# Patient Record
Sex: Female | Born: 1970 | Race: Black or African American | Hispanic: No | Marital: Married | Smoking: Never smoker
Health system: Southern US, Community
[De-identification: ages and names within clinical notes are randomized; demographics above are authoritative.]

## PROBLEM LIST (undated history)

## (undated) DIAGNOSIS — E669 Obesity, unspecified: Secondary | ICD-10-CM

## (undated) DIAGNOSIS — Z8489 Family history of other specified conditions: Secondary | ICD-10-CM

## (undated) DIAGNOSIS — I10 Essential (primary) hypertension: Secondary | ICD-10-CM

## (undated) HISTORY — DX: Obesity, unspecified: E66.9

## (undated) HISTORY — PX: NO PAST SURGERIES: SHX2092

## (undated) HISTORY — DX: Essential (primary) hypertension: I10

## (undated) HISTORY — DX: Family history of other specified conditions: Z84.89

---

## 2005-02-15 ENCOUNTER — Ambulatory Visit: Payer: Self-pay | Admitting: Family Medicine

## 2005-02-19 ENCOUNTER — Ambulatory Visit (HOSPITAL_COMMUNITY): Admission: RE | Admit: 2005-02-19 | Discharge: 2005-02-19 | Payer: Self-pay | Admitting: Family Medicine

## 2005-03-25 ENCOUNTER — Ambulatory Visit: Payer: Self-pay | Admitting: Family Medicine

## 2007-03-04 ENCOUNTER — Ambulatory Visit: Payer: Self-pay | Admitting: Family Medicine

## 2007-03-05 ENCOUNTER — Encounter: Payer: Self-pay | Admitting: Family Medicine

## 2007-03-09 ENCOUNTER — Ambulatory Visit (HOSPITAL_COMMUNITY): Admission: RE | Admit: 2007-03-09 | Discharge: 2007-03-09 | Payer: Self-pay | Admitting: Family Medicine

## 2007-03-11 ENCOUNTER — Ambulatory Visit: Payer: Self-pay | Admitting: Family Medicine

## 2007-03-11 LAB — CONVERTED CEMR LAB
Nitrite: NEGATIVE
RBC / HPF: NONE SEEN (ref <3)
Specific Gravity, Urine: 1.029 (ref 1.005–1.03)
pH: 5 (ref 5.0–8.0)

## 2007-03-12 ENCOUNTER — Encounter: Payer: Self-pay | Admitting: Family Medicine

## 2007-04-06 ENCOUNTER — Ambulatory Visit: Payer: Self-pay | Admitting: Family Medicine

## 2007-04-06 ENCOUNTER — Other Ambulatory Visit: Admission: RE | Admit: 2007-04-06 | Discharge: 2007-04-06 | Payer: Self-pay | Admitting: Family Medicine

## 2007-04-06 ENCOUNTER — Encounter: Payer: Self-pay | Admitting: Family Medicine

## 2007-04-09 ENCOUNTER — Encounter: Payer: Self-pay | Admitting: Family Medicine

## 2007-06-27 ENCOUNTER — Inpatient Hospital Stay (HOSPITAL_COMMUNITY): Admission: AD | Admit: 2007-06-27 | Discharge: 2007-06-27 | Payer: Self-pay | Admitting: Obstetrics and Gynecology

## 2007-12-16 ENCOUNTER — Inpatient Hospital Stay (HOSPITAL_COMMUNITY): Admission: AD | Admit: 2007-12-16 | Discharge: 2007-12-18 | Payer: Self-pay | Admitting: Obstetrics & Gynecology

## 2008-01-28 ENCOUNTER — Ambulatory Visit: Payer: Self-pay | Admitting: Family Medicine

## 2008-01-28 DIAGNOSIS — I10 Essential (primary) hypertension: Secondary | ICD-10-CM | POA: Insufficient documentation

## 2008-01-28 DIAGNOSIS — E669 Obesity, unspecified: Secondary | ICD-10-CM | POA: Insufficient documentation

## 2008-10-19 ENCOUNTER — Ambulatory Visit: Payer: Self-pay | Admitting: Family Medicine

## 2008-10-19 ENCOUNTER — Other Ambulatory Visit: Admission: RE | Admit: 2008-10-19 | Discharge: 2008-10-19 | Payer: Self-pay | Admitting: Family Medicine

## 2008-10-21 ENCOUNTER — Encounter: Payer: Self-pay | Admitting: Family Medicine

## 2008-10-21 LAB — CONVERTED CEMR LAB
BUN: 9 mg/dL (ref 6–23)
Basophils Absolute: 0 10*3/uL (ref 0.0–0.1)
Basophils Relative: 0 % (ref 0–1)
Calcium: 9 mg/dL (ref 8.4–10.5)
Cholesterol: 160 mg/dL (ref 0–200)
Eosinophils Relative: 0 % (ref 0–5)
HCT: 40.9 % (ref 36.0–46.0)
Lymphocytes Relative: 13 % (ref 12–46)
MCHC: 34.7 g/dL (ref 30.0–36.0)
Neutro Abs: 8.4 10*3/uL — ABNORMAL HIGH (ref 1.7–7.7)
Platelets: 309 10*3/uL (ref 150–400)
Potassium: 4.5 meq/L (ref 3.5–5.3)
RDW: 13.2 % (ref 11.5–15.5)
Triglycerides: 73 mg/dL (ref <150)
VLDL: 15 mg/dL (ref 0–40)

## 2008-10-28 ENCOUNTER — Ambulatory Visit (HOSPITAL_COMMUNITY): Admission: RE | Admit: 2008-10-28 | Discharge: 2008-10-28 | Payer: Self-pay | Admitting: Family Medicine

## 2009-07-18 ENCOUNTER — Ambulatory Visit: Payer: Self-pay | Admitting: Family Medicine

## 2009-07-18 ENCOUNTER — Other Ambulatory Visit: Admission: RE | Admit: 2009-07-18 | Discharge: 2009-07-18 | Payer: Self-pay | Admitting: Family Medicine

## 2009-07-18 DIAGNOSIS — R5383 Other fatigue: Secondary | ICD-10-CM

## 2009-07-18 DIAGNOSIS — R5381 Other malaise: Secondary | ICD-10-CM | POA: Insufficient documentation

## 2009-07-20 LAB — CONVERTED CEMR LAB
BUN: 10 mg/dL (ref 6–23)
Basophils Relative: 0 % (ref 0–1)
CO2: 25 meq/L (ref 19–32)
Calcium: 9 mg/dL (ref 8.4–10.5)
Chloride: 104 meq/L (ref 96–112)
Creatinine, Ser: 0.84 mg/dL (ref 0.40–1.20)
Eosinophils Absolute: 0 10*3/uL (ref 0.0–0.7)
Eosinophils Relative: 0 % (ref 0–5)
Glucose, Bld: 86 mg/dL (ref 70–99)
HCT: 39.5 % (ref 36.0–46.0)
Hemoglobin: 13.5 g/dL (ref 12.0–15.0)
Lymphs Abs: 2.3 10*3/uL (ref 0.7–4.0)
MCHC: 34.2 g/dL (ref 30.0–36.0)
MCV: 86.1 fL (ref 78.0–100.0)
Monocytes Absolute: 0.4 10*3/uL (ref 0.1–1.0)
Monocytes Relative: 4 % (ref 3–12)
Neutrophils Relative %: 72 % (ref 43–77)
RBC: 4.59 M/uL (ref 3.87–5.11)
WBC: 9.9 10*3/uL (ref 4.0–10.5)

## 2010-01-30 ENCOUNTER — Ambulatory Visit: Payer: Self-pay | Admitting: Family Medicine

## 2010-04-28 ENCOUNTER — Encounter: Payer: Self-pay | Admitting: Family Medicine

## 2010-04-30 ENCOUNTER — Encounter: Payer: Self-pay | Admitting: Family Medicine

## 2010-05-10 ENCOUNTER — Ambulatory Visit: Payer: Medicaid Other | Admitting: Family Medicine

## 2010-05-10 ENCOUNTER — Ambulatory Visit: Admit: 2010-05-10 | Payer: Self-pay | Admitting: Family Medicine

## 2010-05-10 NOTE — Letter (Signed)
Summary: xray  xray   Imported By: Curtis Sites 09/05/2009 11:01:31  _____________________________________________________________________  External Attachment:    Type:   Image     Comment:   External Document

## 2010-05-10 NOTE — Letter (Signed)
Summary: progress notes  progress notes   Imported By: Curtis Sites 09/05/2009 11:00:47  _____________________________________________________________________  External Attachment:    Type:   Image     Comment:   External Document

## 2010-05-10 NOTE — Assessment & Plan Note (Signed)
Summary: physical   Vital Signs:  Patient profile:   40 year old female Menstrual status:  regular Height:      66.5 inches Weight:      251.25 pounds BMI:     40.09 O2 Sat:      98 % Pulse rate:   91 / minute Pulse rhythm:   regular Resp:     16 per minute BP sitting:   138 / 98  (left arm) Cuff size:   large  Vitals Entered By: Everitt Amber LPN (July 18, 2009 8:52 AM)  Nutrition Counseling: Patient's BMI is greater than 25 and therefore counseled on weight management options. CC: CPE  Vision Screening:Left eye w/o correction: 20 / 20 Right Eye w/o correction: 20 / 20 Both eyes w/o correction:  20/ 20  Color vision testing: normal      Vision Entered By: Everitt Amber LPN (July 18, 2009 8:53 AM)   CC:  CPE.  History of Present Illness: Reports  that she has been doing well. she recently started exercisisng 2 days per week, and is working on changing her eating to facilitate weight loss.she still drinks alot oc calories , and understands the need to change this Denies recent fever or chills. Denies sinus pressure, nasal congestion , ear pain or sore throat. Denies chest congestion, or cough productive of sputum. Denies chest pain, palpitations, PND, orthopnea or leg swelling. Denies abdominal pain, nausea, vomitting, diarrhea or constipation. Denies change in bowel movements or bloody stool. Denies dysuria , frequency, incontinence or hesitancy. Denies  joint pain, swelling, or reduced mobility. Denies headaches, vertigo, seizures. Denies depression, anxiety or insomnia. Denies  rash, lesions, or itch.     Current Medications (verified): 1)  Maxzide 75-50 Mg Tabs (Triamterene-Hctz) .... Take 1 Tablet By Mouth Once A Day  Allergies (verified): No Known Drug Allergies  Review of Systems      See HPI General:  Complains of fatigue. Eyes:  Denies blurring, discharge, double vision, eye pain, halos, itching, and vision loss-1 eye. Endo:  Denies cold  intolerance, excessive hunger, excessive thirst, excessive urination, heat intolerance, polyuria, and weight change. Heme:  Denies abnormal bruising and bleeding. Allergy:  Denies hives or rash and itching eyes.  Physical Exam  General:  Well-developed,obese,in no acute distress; alert,appropriate and cooperative throughout examination Head:  Normocephalic and atraumatic without obvious abnormalities. No apparent alopecia or balding. Eyes:  No corneal or conjunctival inflammation noted. EOMI. Perrla. Funduscopic exam benign, without hemorrhages, exudates or papilledema. Vision grossly normal. Ears:  External ear exam shows no significant lesions or deformities.  Otoscopic examination reveals clear canals, tympanic membranes are intact bilaterally without bulging, retraction, inflammation or discharge. Hearing is grossly normal bilaterally. Nose:  External nasal examination shows no deformity or inflammation. Nasal mucosa are pink and moist without lesions or exudates. Mouth:  Oral mucosa and oropharynx without lesions or exudates.  Teeth in good repair. Neck:  No deformities, masses, or tenderness noted. Chest Wall:  No deformities, masses, or tenderness noted. Breasts:  No mass, nodules, thickening, tenderness, bulging, retraction, inflamation, nipple discharge or skin changes noted.   Lungs:  Normal respiratory effort, chest expands symmetrically. Lungs are clear to auscultation, no crackles or wheezes. Heart:  Normal rate and regular rhythm. S1 and S2 normal without gallop, murmur, click, rub or other extra sounds. Abdomen:  Bowel sounds positive,abdomen soft and non-tender without masses, organomegaly or hernias noted. Genitalia:  Normal introitus for age, no external lesions, no vaginal discharge, mucosa pink  and moist, no vaginal or cervical lesions, no vaginal atrophy, no friaility or hemorrhage, normal uterus size and position, no adnexal masses or tenderness Msk:  No deformity or  scoliosis noted of thoracic or lumbar spine.   Pulses:  R and L carotid,radial,femoral,dorsalis pedis and posterior tibial pulses are full and equal bilaterally Extremities:  No clubbing, cyanosis, edema, or deformity noted with normal full range of motion of all joints.   Neurologic:  No cranial nerve deficits noted. Station and gait are normal. Plantar reflexes are down-going bilaterally. DTRs are symmetrical throughout. Sensory, motor and coordinative functions appear intact. Skin:  Intact without suspicious lesions or rashes Cervical Nodes:  No lymphadenopathy noted Axillary Nodes:  No palpable lymphadenopathy Inguinal Nodes:  No significant adenopathy Psych:  Cognition and judgment appear intact. Alert and cooperative with normal attention span and concentration. No apparent delusions, illusions, hallucinations   Impression & Recommendations:  Problem # 1:  HYPERTENSION (ICD-401.9) Assessment Unchanged  The following medications were removed from the medication list:    Maxzide 75-50 Mg Tabs (Triamterene-hctz) .Marland Kitchen... Take 1 tablet by mouth once a day Her updated medication list for this problem includes:    Maxzide-25 37.5-25 Mg Tabs (Triamterene-hctz) .Marland Kitchen... Take 1 tablet by mouth once a day  Orders: T-Basic Metabolic Panel [phone])  BP today: 138/98, pt has not been taking her bP med Prior BP: 134/90 (10/19/2008)  Labs Reviewed: K+: 4.5 (10/21/2008) Creat: : 0.83 (10/21/2008)   Chol: 160 (10/21/2008)   HDL: 50 (10/21/2008)   LDL: 95 (10/21/2008)   TG: 73 (10/21/2008)  Problem # 2:  OBESITY, UNSPECIFIED (ICD-278.00) Assessment: Deteriorated  Ht: 66.5 (07/18/2009)   Wt: 251.25 (07/18/2009)   BMI: 40.09 (07/18/2009), counselled for 10 mins and given literature on dietary change and the importance of exercise  Problem # 3:  FATIGUE (ICD-780.79) Assessment: Comment Only  Orders: T-TSH ([id number]) T-CBC w/Diff ([id number])  Complete Medication List: 1)  Maxzide-25  37.5-25 Mg Tabs (Triamterene-hctz) .... Take 1 tablet by mouth once a day  Other Orders: Tdap => 17yrs IM (84132) Admin 1st Vaccine (44010) Admin 1st Vaccine [facility]) [id number]) Pap Smear (64403)   Patient Instructions: 1)  Please schedule a follow-up appointment in 4 months. 2)  It is important that you exercise regularly at least 45 minutes 6 times a week. If you develop chest pain, have severe difficulty breathing, or feel very tired , stop exercising immediately and seek medical attention. 3)  You need to lose weight. Consider a lower calorie diet and regular exercise. Goal is 3 to 5 pounds per monmth 4)  BMP prior to visit, ICD-9: 5)  TSH prior to visit, ICD-9: 6)  CBC w/ Diff prior to visit, ICD-9: Prescriptions: MAXZIDE-25 37.5-25 MG TABS (TRIAMTERENE-HCTZ) Take 1 tablet by mouth once a day  #30 x 4   Entered and Authorized by:   Syliva Overman MD   Signed by:   Syliva Overman MD on 07/18/2009   Method used:   Electronically to        [facility] [address] # [phone]* (retail)       [address]       [place], [place]  [address]       Ph: [phone]       Fax: [phone]   RxID:   [id number]     Tetanus/Td Vaccine    Vaccine Type: Tdap    Site: left deltoid    Mfr: boosterix    Dose: 0.5 ml    Route: IM  Given by: Everitt Amber LPN    Exp. Date: 07/01/2011    Lot #: [id number]

## 2010-05-10 NOTE — Letter (Signed)
Summary: phone notes  phone notes   Imported By: Curtis Sites 09/05/2009 11:00:17  _____________________________________________________________________  External Attachment:    Type:   Image     Comment:   External Document

## 2010-05-10 NOTE — Assessment & Plan Note (Signed)
Summary: office visit   Vital Signs:  Patient profile:   40 year old female Menstrual status:  regular Height:      66.5 inches Weight:      247 pounds BMI:     39.41 O2 Sat:      98 % on Room air Pulse rate:   101 / minute Pulse rhythm:   regular Resp:     16 per minute BP sitting:   120 / 90  (left arm)  Vitals Entered By: Mauricia Area CMA (January 30, 2010 2:59 PM)  Nutrition Counseling: Patient's BMI is greater than 25 and therefore counseled on weight management options.  O2 Flow:  Room air CC: follow up   CC:  follow up.  History of Present Illness: Reports  that  she has been doing well. Her primary health concern is obesity, which she has been working at intermittently with exercise and dietary change. She intends to become more consistent in her efforts. Denies recent fever or chills. Denies sinus pressure, nasal congestion , ear pain or sore throat. Denies chest congestion, or cough productive of sputum. Denies chest pain, palpitations, PND, orthopnea or leg swelling. Denies abdominal pain, nausea, vomitting, diarrhea or constipation. Denies change in bowel movements or bloody stool. Denies dysuria , frequency, incontinence or hesitancy. Denies  joint pain, swelling, or reduced mobility. Denies headaches, vertigo, seizures. Denies depression, anxiety or insomnia. Denies  rash, lesions, or itch.     Current Medications (verified): 1)  Maxzide-25 37.5-25 Mg Tabs (Triamterene-Hctz) .... Take 1 Tablet By Mouth Once A Day  Allergies (verified): No Known Drug Allergies  Review of Systems      See HPI General:  Complains of fatigue. Eyes:  Denies discharge, eye pain, and red eye. Endo:  Denies cold intolerance, excessive hunger, excessive thirst, and heat intolerance. Heme:  Denies abnormal bruising and bleeding. Allergy:  Complains of seasonal allergies; denies hives or rash and itching eyes.  Physical Exam  General:  Well-developed,obese,in no acute  distress; alert,appropriate and cooperative throughout examination HEENT: No facial asymmetry,  EOMI, No sinus tenderness, TM's Clear, oropharynx  pink and moist.   Chest: Clear to auscultation bilaterally.  CVS: S1, S2, No murmurs, No S3.   Abd: Soft, Nontender.  MS: Adequate ROM spine, hips, shoulders and knees.  Ext: No edema.   CNS: CN 2-12 intact, power tone and sensation normal throughout.   Skin: Intact, no visible lesions or rashes.  Psych: Good eye contact, normal affect.  Memory intact, not anxious or depressed appearing.    Impression & Recommendations:  Problem # 1:  OBESITY, UNSPECIFIED (ICD-278.00) Assessment Improved  Orders: T-Lipid Profile ([id number]) T- Hemoglobin A1C (21308-65784)  Ht: 66.5 (01/30/2010)   Wt: 247 (01/30/2010)   BMI: 39.41 (01/30/2010) therapeutic lifestyle change discussed and encouraged  Problem # 2:  HYPERTENSION (ICD-401.9) Assessment: Improved  Her updated medication list for this problem includes:    Maxzide-25 37.5-25 Mg Tabs (Triamterene-hctz) .Marland Kitchen... Take 1 tablet by mouth once a day  Orders: T-Basic Metabolic Panel [id number])  BP today: 120/90 Prior BP: 138/98 (07/18/2009)  Labs Reviewed: K+: 3.8 (07/18/2009) Creat: : 0.84 (07/18/2009)   Chol: 160 (10/21/2008)   HDL: 50 (10/21/2008)   LDL: 95 (10/21/2008)   TG: 73 (10/21/2008)  Complete Medication List: 1)  Maxzide-25 37.5-25 Mg Tabs (Triamterene-hctz) .... Take 1 tablet by mouth once a day  Other Orders: T-Vitamin D (25-Hydroxy) ([id number])  Patient Instructions: 1)  Please schedule a follow-up appointment in 3 months. 2)  It is important that you exercise regularly at least 30 minutes 5 times a week. If you develop chest pain, have severe difficulty breathing, or feel very tired , stop exercising immediately and seek medical attention. 3)  You need to lose weight. Consider a lower calorie diet and regular exercise. Goal is 10 pounds in the next 3 months 4)  your  bP today is 120/90. lifestyle change as discussed woill improvethis, youy absolutely need to follow through 5)  you will get the dASH diet also low fat diet.also a 1200 cal diet sheet 6)  BMP prior to visit, ICD-9: 7)  Lipid Panel prior to visit, ICD-9:fasting asap 8)  HbgA1C prior to visit, ICD-9: 9)  vitamin D   Orders Added: 1)  Est. Patient Level IV [56387] 2)  T-Basic Metabolic Panel [[id number]] 3)  T-Lipid Profile [80061-22930] 4)  T- Hemoglobin A1C [83036-23375] 5)  T-Vitamin D (25-Hydroxy) [[id number]]

## 2010-05-10 NOTE — Letter (Signed)
Summary: labs  labs   Imported By: Curtis Sites 09/05/2009 11:01:08  _____________________________________________________________________  External Attachment:    Type:   Image     Comment:   External Document

## 2010-05-10 NOTE — Letter (Signed)
Summary: demographic  demographic   Imported By: Curtis Sites 09/05/2009 10:58:18  _____________________________________________________________________  External Attachment:    Type:   Image     Comment:   External Document

## 2010-05-10 NOTE — Letter (Signed)
Summary: consults  consults   Imported By: Curtis Sites 09/05/2009 11:00:00  _____________________________________________________________________  External Attachment:    Type:   Image     Comment:   External Document

## 2010-05-10 NOTE — Letter (Signed)
Summary: history and physical  history and physical   Imported By: Curtis Sites 09/05/2009 10:59:16  _____________________________________________________________________  External Attachment:    Type:   Image     Comment:   External Document

## 2010-05-11 ENCOUNTER — Encounter: Payer: Self-pay | Admitting: Family Medicine

## 2010-05-15 ENCOUNTER — Telehealth: Payer: Self-pay | Admitting: Family Medicine

## 2010-05-16 NOTE — Letter (Signed)
Summary: 1st missed letter  1st missed letter   Imported By: Lind Guest 05/11/2010 15:43:14  _____________________________________________________________________  External Attachment:    Type:   Image     Comment:   External Document

## 2010-05-17 ENCOUNTER — Encounter: Payer: Self-pay | Admitting: Family Medicine

## 2010-05-21 ENCOUNTER — Ambulatory Visit (INDEPENDENT_AMBULATORY_CARE_PROVIDER_SITE_OTHER): Payer: 59 | Admitting: Family Medicine

## 2010-05-21 ENCOUNTER — Encounter: Payer: Self-pay | Admitting: Family Medicine

## 2010-05-21 DIAGNOSIS — E669 Obesity, unspecified: Secondary | ICD-10-CM

## 2010-05-24 NOTE — Progress Notes (Signed)
  Phone Note Other Incoming   Caller: drb  simpson Summary of Call: pls call Leni get the dates and detasils of when her mother was so sick she had to stay home. she has an FMLA form for me to fill out and I am lost! Initial call taken by: Syliva Overman MD,  May 15, 2010 11:57 AM  Follow-up for Phone Call        tried to call her twice but no answer and cannot leave msg Follow-up by: Everitt Amber LPN,  May 15, 2010 2:41 PM  Additional Follow-up for Phone Call Additional follow up Details #1::        It was for thursday and friday (Jan 26th-27th) [phone] cell I filled out the office info on form and laid on your bookshelf Additional Follow-up by: Everitt Amber LPN,  May 15, 2010 3:28 PM    Additional Follow-up for Phone Call Additional follow up Details #2::    spoke directly with francine who states her vertigo was so severe thatr she couldnot stand onJan 26, had to call her daughter from work, she has had disabling vertigo in the past, will fill out form Follow-up by: Syliva Overman MD,  May 15, 2010 5:33 PM  Additional Follow-up for Phone Call Additional follow up Details #3:: Details for Additional Follow-up Action Taken: form completed, pls call for collection Additional Follow-up by: Syliva Overman MD,  May 15, 2010 5:33 PM

## 2010-05-24 NOTE — Letter (Signed)
Summary: fmla paper  fmla paper   Imported By: Lind Guest 05/17/2010 14:18:35  _____________________________________________________________________  External Attachment:    Type:   Image     Comment:   External Document

## 2010-05-30 NOTE — Assessment & Plan Note (Signed)
Summary: flma papers   Allergies: No Known Drug Allergies   Complete Medication List: 1)  Maxzide-25 37.5-25 Mg Tabs (Triamterene-hctz) .... Take 1 tablet by mouth once a day  Other Orders: Form Completion (16109)   Orders Added: 1)  Form Completion [60454]

## 2010-06-01 ENCOUNTER — Encounter: Payer: Self-pay | Admitting: Family Medicine

## 2010-06-01 ENCOUNTER — Telehealth (INDEPENDENT_AMBULATORY_CARE_PROVIDER_SITE_OTHER): Payer: Self-pay | Admitting: *Deleted

## 2010-06-05 NOTE — Letter (Signed)
Summary: changes to fmla papers  changes to fmla papers   Imported By: Lind Guest 06/01/2010 14:27:01  _____________________________________________________________________  External Attachment:    Type:   Image     Comment:   External Document

## 2010-06-14 NOTE — Progress Notes (Signed)
Summary: updated fmla papers  Phone Note Call from Patient   Summary of Call: left message for her to call back. I want to know where does she wants the updated fmla papers to be faxed to. Initial call taken by: Lind Guest,  June 01, 2010 2:49 PM  Follow-up for Phone Call        patient called back and the paper was faxed and mailed her the orginal Follow-up by: Lind Guest,  June 04, 2010 8:46 AM

## 2010-12-31 LAB — WET PREP, GENITAL: Yeast Wet Prep HPF POC: NONE SEEN

## 2010-12-31 LAB — URINALYSIS, ROUTINE W REFLEX MICROSCOPIC
Glucose, UA: NEGATIVE
Ketones, ur: NEGATIVE
Leukocytes, UA: NEGATIVE
Nitrite: NEGATIVE
pH: 5.5

## 2010-12-31 LAB — URINE MICROSCOPIC-ADD ON

## 2011-01-09 LAB — CBC
HCT: 38.7
Hemoglobin: 13.1
MCHC: 33.9
MCHC: 33.9
MCV: 92.2
Platelets: 234
Platelets: 261
RDW: 13.6

## 2011-01-09 LAB — CCBB MATERNAL DONOR DRAW

## 2011-05-24 ENCOUNTER — Telehealth: Payer: Self-pay | Admitting: Family Medicine

## 2011-05-24 NOTE — Telephone Encounter (Signed)
Yes you can schedule her for a mammopgram that day, pls do so

## 2011-05-31 ENCOUNTER — Other Ambulatory Visit: Payer: Self-pay | Admitting: Family Medicine

## 2011-05-31 DIAGNOSIS — Z139 Encounter for screening, unspecified: Secondary | ICD-10-CM

## 2011-05-31 NOTE — Telephone Encounter (Signed)
Schedule 2.26.13 @ 8:00 am register @ 7:45 am no powders, lotion or deo and for 2.27.13 that is the day for diagnostic mammograms left message for patient to call back

## 2011-05-31 NOTE — Telephone Encounter (Signed)
Left message for patient to call back x 2 today.

## 2011-06-04 ENCOUNTER — Ambulatory Visit (HOSPITAL_COMMUNITY)
Admission: RE | Admit: 2011-06-04 | Discharge: 2011-06-04 | Disposition: A | Discharge status: Home / Self Care | Payer: 59 | Source: Ambulatory Visit | Attending: Family Medicine | Admitting: Family Medicine

## 2011-06-04 DIAGNOSIS — Z139 Encounter for screening, unspecified: Secondary | ICD-10-CM

## 2011-06-04 DIAGNOSIS — Z1231 Encounter for screening mammogram for malignant neoplasm of breast: Secondary | ICD-10-CM | POA: Insufficient documentation

## 2011-06-05 ENCOUNTER — Ambulatory Visit (INDEPENDENT_AMBULATORY_CARE_PROVIDER_SITE_OTHER): Payer: 59 | Admitting: Family Medicine

## 2011-06-05 ENCOUNTER — Encounter: Payer: Self-pay | Admitting: Family Medicine

## 2011-06-05 ENCOUNTER — Other Ambulatory Visit (HOSPITAL_COMMUNITY)
Admission: RE | Admit: 2011-06-05 | Discharge: 2011-06-05 | Disposition: A | Discharge status: Home / Self Care | Payer: 59 | Source: Ambulatory Visit | Attending: Family Medicine | Admitting: Family Medicine

## 2011-06-05 VITALS — BP 140/98 | HR 90 | Resp 16 | Ht 65.0 in | Wt 253.0 lb

## 2011-06-05 DIAGNOSIS — Z124 Encounter for screening for malignant neoplasm of cervix: Secondary | ICD-10-CM

## 2011-06-05 DIAGNOSIS — Z0000 Encounter for general adult medical examination without abnormal findings: Secondary | ICD-10-CM

## 2011-06-05 DIAGNOSIS — Z01419 Encounter for gynecological examination (general) (routine) without abnormal findings: Secondary | ICD-10-CM | POA: Insufficient documentation

## 2011-06-05 DIAGNOSIS — Z1211 Encounter for screening for malignant neoplasm of colon: Secondary | ICD-10-CM

## 2011-06-05 DIAGNOSIS — E669 Obesity, unspecified: Secondary | ICD-10-CM

## 2011-06-05 DIAGNOSIS — E559 Vitamin D deficiency, unspecified: Secondary | ICD-10-CM

## 2011-06-05 DIAGNOSIS — I10 Essential (primary) hypertension: Secondary | ICD-10-CM

## 2011-06-05 LAB — POC HEMOCCULT BLD/STL (OFFICE/1-CARD/DIAGNOSTIC): Fecal Occult Blood, POC: NEGATIVE

## 2011-06-05 MED ORDER — TRIAMTERENE-HCTZ 37.5-25 MG PO TABS: 1.0000 | ORAL_TABLET | Freq: Every day | ORAL | Status: DC

## 2011-06-05 NOTE — Patient Instructions (Addendum)
F/u in 2 month  Pls utilize ALL resources  through your job to help with healthy lifestyle changes   It is important that you exercise regularly at least 30 minutes 5 times a week. If you develop chest pain, have severe difficulty breathing, or feel very tired, stop exercising immediately and seek medical attention    A healthy diet is rich in fruit, vegetables and whole grains. Poultry fish, nuts and beans are a healthy choice for protein rather then red meat. A low sodium diet and drinking 64 ounces of water daily is generally recommended. Oils and sweet should be limited. Carbohydrates especially for those who are diabetic or overweight, should be limited to 30-45 gram per meal. It is important to eat on a regular schedule, at least 3 times daily. Snacks should be primarily fruits, vegetables or nuts.  Yoou will get a 1500 cal diet sheet  Weight loss goal is 5 pounds per month  labs today cbc, chem 7, lipid, HBA1C , TSH and vit D  Check on the  TdAP coverage  It is vital you take the maxzide every day for blood pressure control to reduce the risk of organ damage

## 2011-06-06 DIAGNOSIS — E559 Vitamin D deficiency, unspecified: Secondary | ICD-10-CM | POA: Insufficient documentation

## 2011-06-06 LAB — HEMOGLOBIN A1C
Hgb A1c MFr Bld: 5 % (ref <5.7)
Mean Plasma Glucose: 97 mg/dL (ref <117)

## 2011-06-06 LAB — LIPID PANEL
Cholesterol: 159 mg/dL (ref 0–200)
LDL Cholesterol: 83 mg/dL (ref 0–99)
Triglycerides: 158 mg/dL — ABNORMAL HIGH (ref <150)

## 2011-06-06 LAB — CBC
MCH: 28.9 pg (ref 26.0–34.0)
MCHC: 33.4 g/dL (ref 30.0–36.0)
Platelets: 318 10*3/uL (ref 150–400)
RDW: 13.1 % (ref 11.5–15.5)

## 2011-06-06 LAB — BASIC METABOLIC PANEL
Calcium: 9 mg/dL (ref 8.4–10.5)
Potassium: 4.1 mEq/L (ref 3.5–5.3)
Sodium: 141 mEq/L (ref 135–145)

## 2011-06-06 MED ORDER — ERGOCALCIFEROL 1.25 MG (50000 UT) PO CAPS: 50000.0000 [IU] | ORAL_CAPSULE | ORAL | Status: DC

## 2011-06-06 NOTE — Assessment & Plan Note (Signed)
Deteriorated. Patient re-educated about  the importance of commitment to a  minimum of 150 minutes of exercise per week. The importance of healthy food choices with portion control discussed. Encouraged to start a food diary, count calories and to consider  joining a support group. Sample diet sheets offered. Goals set by the patient for the next several months.    

## 2011-06-06 NOTE — Assessment & Plan Note (Signed)
Uncontrolled due to medical non compliance, also DASH diet , weight loss and regular exercise stressed

## 2011-06-06 NOTE — Progress Notes (Signed)
  Subjective:    Patient ID: Sherry Wagner, female    DOB: Aug 11, 1970, 41 y.o.   MRN: [id number]  HPI The PT is here for annual exam  and re-evaluation of chronic medical conditions, medication management and review of any available recent lab and radiology data.  Preventive health is updated, specifically  Cancer screening and Immunization.   Pt has been non compliant with her blood pressure medication, not taking it on a regular basis. States she is motivated at this time to change to a healthier lifestyle to improve her health, she has her 74th birthday today!     Review of Systems See HPI Denies recent fever or chills. Denies sinus pressure, nasal congestion, ear pain or sore throat. Denies chest congestion, productive cough or wheezing. Denies chest pains, palpitations and leg swelling Denies abdominal pain, nausea, vomiting,diarrhea or constipation.   Denies dysuria, frequency, hesitancy or incontinence. Denies joint pain, swelling and limitation in mobility. Denies headaches, seizures, numbness, or tingling. Denies depression, anxiety or insomnia. Denies skin break down or rash.        Objective:   Physical Exam   Pleasant obese female, alert and oriented x 3, in no cardio-pulmonary distress. Afebrile. HEENT No facial trauma or asymetry. Sinuses non tender.  EOMI, PERTL, fundoscopic exam is normal, no hemorhage or exudate.  External ears normal, tympanic membranes clear. Oropharynx moist, no exudate, good dentition. Neck: supple, no adenopathy,JVD or thyromegaly.No bruits.  Chest: Clear to ascultation bilaterally.No crackles or wheezes. Non tender to palpation  Breast: No asymetry,no masses. No nipple discharge or inversion. No axillary or supraclavicular adenopathy  Cardiovascular system; Heart sounds normal,  S1 and  S2 ,no S3.  No murmur, or thrill. Apical beat not displaced Peripheral pulses normal.  Abdomen: Soft, non tender, no organomegaly or  masses. No bruits. Bowel sounds normal. No guarding, tenderness or rebound.  Rectal:  No mass. Guaiac negative stool.  GU: External genitalia normal. No lesions. Vaginal canal normal.No discharge. Uterus normal size, no adnexal masses, no cervical motion or adnexal tenderness.  Musculoskeletal exam: Full ROM of spine, hips , shoulders and knees. No deformity ,swelling or crepitus noted. No muscle wasting or atrophy.   Neurologic: Cranial nerves 2 to 12 intact. Power, tone ,sensation and reflexes normal throughout. No disturbance in gait. No tremor.  Skin: Intact, no ulceration, erythema , scaling or rash noted. Pigmentation normal throughout  Psych; Normal mood and affect. Judgement and concentration normal      Assessment & Plan:

## 2011-06-06 NOTE — Assessment & Plan Note (Signed)
New dx based on recent lab will send in supplement and let pt know

## 2011-08-14 ENCOUNTER — Encounter: Payer: Self-pay | Admitting: Family Medicine

## 2011-08-14 ENCOUNTER — Ambulatory Visit (INDEPENDENT_AMBULATORY_CARE_PROVIDER_SITE_OTHER): Payer: 59 | Admitting: Family Medicine

## 2011-08-14 VITALS — BP 122/82 | HR 80 | Resp 16 | Ht 65.0 in | Wt 241.0 lb

## 2011-08-14 DIAGNOSIS — I10 Essential (primary) hypertension: Secondary | ICD-10-CM

## 2011-08-14 DIAGNOSIS — E8881 Metabolic syndrome: Secondary | ICD-10-CM

## 2011-08-14 DIAGNOSIS — E559 Vitamin D deficiency, unspecified: Secondary | ICD-10-CM

## 2011-08-14 DIAGNOSIS — M109 Gout, unspecified: Secondary | ICD-10-CM

## 2011-08-14 DIAGNOSIS — E669 Obesity, unspecified: Secondary | ICD-10-CM

## 2011-08-14 LAB — URIC ACID: Uric Acid, Serum: 6.2 mg/dL (ref 2.4–7.0)

## 2011-08-14 MED ORDER — TRIAMTERENE-HCTZ 37.5-25 MG PO TABS: 1.0000 | ORAL_TABLET | Freq: Every day | ORAL | Status: DC

## 2011-08-14 MED ORDER — ERGOCALCIFEROL 1.25 MG (50000 UT) PO CAPS: 50000.0000 [IU] | ORAL_CAPSULE | ORAL | Status: AC

## 2011-08-14 NOTE — Patient Instructions (Addendum)
F/u in 4 month  Congrats on weight loss, keep it up!   Keep up the exercise  Uric acid level today  Chem 7 , and vit D in 4 months, non fasting  Before next visit

## 2011-08-14 NOTE — Assessment & Plan Note (Signed)
Improved. Pt applauded on succesful weight loss through lifestyle change, and encouraged to continue same. Weight loss goal set for the next several months.  

## 2011-08-14 NOTE — Progress Notes (Signed)
  Subjective:    Patient ID: Sherry Wagner, female    DOB: 1971/02/01, 41 y.o.   MRN: [id number]  HPI The PT is here for follow up and re-evaluation of chronic medical conditions, medication management and review of any available recent lab and radiology data.  Preventive health is updated, specifically  Cancer screening and Immunization.   Questions or concerns regarding consultations or procedures which the PT has had in the interim are  addressed. The PT denies any adverse reactions to current medications since the last visit.  Pt has been very diligent with changes in eating, does calorie counted food diary, exercising regularly, and has been losing weight very well. Questions about fertility, wants another child, will try for 6 months before seeking help from specialist, her spouse's 15y/o son died      Review of Systems See HPI    Denies recent fever or chills. Denies sinus pressure, nasal congestion, ear pain or sore throat. Denies chest congestion, productive cough or wheezing. Denies chest pains, palpitations and leg swelling Denies abdominal pain, nausea, vomiting,diarrhea or constipation.   Denies dysuria, frequency, hesitancy or incontinence. Denies headaches, seizures, numbness, or tingling. Denies depression, anxiety or insomnia. Denies skin break down or rash. C/o left great toe pain after wearing a particular shoe, wants to be checked for gout was told she had this in the past. This will determine choice of BP med        Objective:   Physical Exam Patient alert and oriented and in no cardiopulmonary distress.  HEENT: No facial asymmetry, EOMI, no sinus tenderness,  oropharynx pink and moist.  Neck supple no adenopathy.  Chest: Clear to auscultation bilaterally.  CVS: S1, S2 no murmurs, no S3.  ABD: Soft non tender. Bowel sounds normal.  Ext: No edema  MS: Adequate ROM spine, shoulders, hips and knees.  Skin: Intact, no ulcerations or rash  noted.  Psych: Good eye contact, normal affect. Memory intact not anxious or depressed appearing.  CNS: CN 2-12 intact, power, tone and sensation normal throughout.        Assessment & Plan:

## 2011-08-14 NOTE — Assessment & Plan Note (Signed)
Controlled, no change in medication  

## 2011-08-18 DIAGNOSIS — E8881 Metabolic syndrome: Secondary | ICD-10-CM | POA: Insufficient documentation

## 2011-08-18 NOTE — Assessment & Plan Note (Signed)
Pt working aggressively and sucesfully on weight loss through lifestyle change

## 2011-08-18 NOTE — Assessment & Plan Note (Signed)
Weekly vit D and re evaluate after 6 months therapy

## 2011-12-19 ENCOUNTER — Ambulatory Visit: Payer: 59 | Admitting: Family Medicine

## 2011-12-31 ENCOUNTER — Encounter: Payer: Self-pay | Admitting: Family Medicine

## 2011-12-31 ENCOUNTER — Ambulatory Visit (INDEPENDENT_AMBULATORY_CARE_PROVIDER_SITE_OTHER): Payer: 59 | Admitting: Family Medicine

## 2011-12-31 VITALS — BP 130/90 | HR 70 | Resp 15 | Ht 65.0 in | Wt 233.0 lb

## 2011-12-31 DIAGNOSIS — E669 Obesity, unspecified: Secondary | ICD-10-CM

## 2011-12-31 DIAGNOSIS — I10 Essential (primary) hypertension: Secondary | ICD-10-CM

## 2011-12-31 DIAGNOSIS — E559 Vitamin D deficiency, unspecified: Secondary | ICD-10-CM

## 2011-12-31 NOTE — Progress Notes (Signed)
  Subjective:    Patient ID: Sherry Wagner, female    DOB: [birth year], 41 y.o.   MRN: [id number]  HPI The PT is here for follow up and re-evaluation of chronic medical conditions, medication management and review of any available recent lab and radiology data.  Preventive health is updated, specifically  Cancer screening and Immunization.   Questions or concerns regarding consultations or procedures which the PT has had in the interim are  Addressed.Still wants to have another child, and will call her oB to set up appt The PT denies any adverse reactions to current medications since the last visit. Unfortunately, she "forgets " to take BP meds on a regular basis, pressure is high today, she is asymptomatic There are no new concerns. Excited about new eating habits and resultant excellent weight loss There are no specific complaints   `    Review of Systems See HPI Denies recent fever or chills. Denies sinus pressure, nasal congestion, ear pain or sore throat. Denies chest congestion, productive cough or wheezing. Denies chest pains, palpitations and leg swelling Denies abdominal pain, nausea, vomiting,diarrhea or constipation.   Denies dysuria, frequency, hesitancy or incontinence. Denies joint pain, swelling and limitation in mobility. Denies headaches, seizures, numbness, or tingling. Denies depression, anxiety or insomnia. Denies skin break down or rash.        Objective:   Physical Exam Patient alert and oriented and in no cardiopulmonary distress.  HEENT: No facial asymmetry, EOMI, no sinus tenderness,  oropharynx pink and moist.  Neck supple no adenopathy.  Chest: Clear to auscultation bilaterally.  CVS: S1, S2 no murmurs, no S3.  ABD: Soft non tender. Bowel sounds normal.  Ext: No edema  MS: Adequate ROM spine, shoulders, hips and knees.  Skin: Intact, no ulcerations or rash noted.  Psych: Good eye contact, normal affect. Memory intact not anxious or depressed  appearing.  CNS: CN 2-12 intact, power, tone and sensation normal throughout.        Assessment & Plan:

## 2011-12-31 NOTE — Patient Instructions (Addendum)
F/u in 4 month, CONGRATS , keep it up  Weight loss goal is 8 to 10 pounds  It is important that you exercise regularly at least 30 minutes 5 times a week. If you develop chest pain, have severe difficulty breathing, or feel very tired, stop exercising immediately and seek medical attention    Please take your blood pressure med daily, your pressure is high today.  Please take weekly vit D, and one multivitamin once daily  Vit D level in month, and fasting chem 7 in 4 month  Flu vaccine today

## 2012-01-05 NOTE — Assessment & Plan Note (Signed)
Improved. Pt applauded on succesful weight loss through lifestyle change, and encouraged to continue same. Weight loss goal set for the next several months.  

## 2012-01-05 NOTE — Assessment & Plan Note (Addendum)
Un Controlled, no change in medication, pt "forgets" to take medication on a weekly basis.Importance of med compliance stressed DASH diet and commitment to daily physical activity for a minimum of 30 minutes discussed and encouraged, as a part of hypertension management. The importance of attaining a healthy weight is also discussed.

## 2012-01-05 NOTE — Assessment & Plan Note (Signed)
Uncorrected, pt to take meds as prescribed

## 2012-05-04 ENCOUNTER — Encounter: Payer: Self-pay | Admitting: Family Medicine

## 2012-05-04 ENCOUNTER — Ambulatory Visit (INDEPENDENT_AMBULATORY_CARE_PROVIDER_SITE_OTHER): Payer: 59 | Admitting: Family Medicine

## 2012-05-04 VITALS — BP 122/82 | HR 103 | Resp 16 | Ht 65.0 in | Wt 241.0 lb

## 2012-05-04 DIAGNOSIS — I10 Essential (primary) hypertension: Secondary | ICD-10-CM

## 2012-05-04 DIAGNOSIS — R5381 Other malaise: Secondary | ICD-10-CM

## 2012-05-04 DIAGNOSIS — Z1211 Encounter for screening for malignant neoplasm of colon: Secondary | ICD-10-CM

## 2012-05-04 DIAGNOSIS — Z13220 Encounter for screening for lipoid disorders: Secondary | ICD-10-CM

## 2012-05-04 DIAGNOSIS — E669 Obesity, unspecified: Secondary | ICD-10-CM

## 2012-05-04 DIAGNOSIS — E8881 Metabolic syndrome: Secondary | ICD-10-CM

## 2012-05-04 DIAGNOSIS — E559 Vitamin D deficiency, unspecified: Secondary | ICD-10-CM

## 2012-05-04 NOTE — Assessment & Plan Note (Signed)
Controlled, no change in medication DASH diet and commitment to daily physical activity for a minimum of 30 minutes discussed and encouraged, as a part of hypertension management. The importance of attaining a healthy weight is also discussed.  

## 2012-05-04 NOTE — Progress Notes (Signed)
  Subjective:    Patient ID: Sherry Wagner, female    DOB: 09/23/1970, 42 y.o.   MRN: [id number]  HPI The PT is here for follow up and re-evaluation of chronic medical conditions, medication management and review of any available recent lab and radiology data.  Preventive health is updated, specifically  Cancer screening and Immunization.   Questions or concerns regarding consultations or procedures which the PT has had in the interim are  addressed. The PT denies any adverse reactions to current medications since the last visit.  Requests colonoscopy,bowel movements have always been irregular, cousin has colon disease, and grandfather had colon ca. Concerned about weight gain will work at this again  There are no specific complaints       Review of Systems See HPI Denies recent fever or chills. Denies sinus pressure, nasal congestion, ear pain or sore throat. Denies chest congestion, productive cough or wheezing. Denies chest pains, palpitations and leg swelling Denies abdominal pain, nausea, vomiting,diarrhea or constipation.   Denies dysuria, frequency, hesitancy or incontinence. Denies joint pain, swelling and limitation in mobility. Denies headaches, seizures, numbness, or tingling. Denies depression, anxiety or insomnia. Denies skin break down or rash.        Objective:   Physical Exam   Patient alert and oriented and in no cardiopulmonary distress.  HEENT: No facial asymmetry, EOMI, no sinus tenderness,  oropharynx pink and moist.  Neck supple no adenopathy.  Chest: Clear to auscultation bilaterally.  CVS: S1, S2 no murmurs, no S3.  ABD: Soft non tender. Bowel sounds normal.  Ext: No edema  MS: Adequate ROM spine, shoulders, hips and knees.  Skin: Intact, no ulcerations or rash noted.  Psych: Good eye contact, normal affect. Memory intact not anxious or depressed appearing.  CNS: CN 2-12 intact, power, tone and sensation normal throughout.        Assessment & Plan:

## 2012-05-04 NOTE — Patient Instructions (Addendum)
CPE and pap in 4 month  Please commit to 150 minutes of physical activity every week minimum  Weight loss goal of 4 pounds per month  Pls sched mammogram at [facility] center, just  Call in.  youi are referred to Dr Karilyn Cota re GI issues.  Start folic acid 1mg  daily    Fasting lipid, chem 7, HBa1C , vit D, TSH, CBC ass soon as possible

## 2012-05-04 NOTE — Assessment & Plan Note (Signed)
Updated HBa1C to be obtained It is important that you exercise regularly at least 30 minutes 5 times a week. If you develop chest pain, have severe difficulty breathing, or feel very tired, stop exercising immediately and seek medical attention  A healthy diet is rich in fruit, vegetables and whole grains. Poultry fish, nuts and beans are a healthy choice for protein rather then red meat. A low sodium diet and drinking 64 ounces of water daily is generally recommended. Oils and sweet should be limited. Carbohydrates especially for those who are diabetic or overweight, should be limited to 34-45 gram per meal. It is important to eat on a regular schedule, at least 3 times daily. Snacks should be primarily fruits, vegetables or nuts.

## 2012-05-04 NOTE — Assessment & Plan Note (Signed)
Deteriorated. Patient re-educated about  the importance of commitment to a  minimum of 150 minutes of exercise per week. The importance of healthy food choices with portion control discussed. Encouraged to start a food diary, count calories and to consider  joining a support group. Sample diet sheets offered. Goals set by the patient for the next several months.    

## 2012-05-04 NOTE — Assessment & Plan Note (Signed)
Non compliant with med will recheck level

## 2012-05-05 ENCOUNTER — Encounter (INDEPENDENT_AMBULATORY_CARE_PROVIDER_SITE_OTHER): Payer: Self-pay | Admitting: *Deleted

## 2012-06-04 ENCOUNTER — Other Ambulatory Visit: Payer: Self-pay

## 2012-06-04 MED ORDER — TRIAMTERENE-HCTZ 37.5-25 MG PO TABS: 1.0000 | ORAL_TABLET | Freq: Every day | ORAL | Status: DC

## 2012-09-29 ENCOUNTER — Other Ambulatory Visit: Payer: Self-pay

## 2012-09-29 DIAGNOSIS — Z1231 Encounter for screening mammogram for malignant neoplasm of breast: Secondary | ICD-10-CM

## 2012-10-05 ENCOUNTER — Ambulatory Visit
Admission: RE | Admit: 2012-10-05 | Discharge: 2012-10-05 | Disposition: A | Discharge status: Home / Self Care | Payer: 59 | Source: Ambulatory Visit

## 2012-10-05 DIAGNOSIS — Z1231 Encounter for screening mammogram for malignant neoplasm of breast: Secondary | ICD-10-CM

## 2012-11-14 ENCOUNTER — Other Ambulatory Visit: Payer: Self-pay | Admitting: Family Medicine

## 2012-11-17 ENCOUNTER — Encounter: Payer: Self-pay | Admitting: Family Medicine

## 2012-11-17 ENCOUNTER — Other Ambulatory Visit (HOSPITAL_COMMUNITY)
Admission: RE | Admit: 2012-11-17 | Discharge: 2012-11-17 | Disposition: A | Discharge status: Home / Self Care | Payer: 59 | Source: Ambulatory Visit | Attending: Family Medicine | Admitting: Family Medicine

## 2012-11-17 ENCOUNTER — Ambulatory Visit (INDEPENDENT_AMBULATORY_CARE_PROVIDER_SITE_OTHER): Payer: 59 | Admitting: Family Medicine

## 2012-11-17 VITALS — BP 130/80 | HR 95 | Resp 16 | Ht 65.0 in | Wt 248.4 lb

## 2012-11-17 DIAGNOSIS — E559 Vitamin D deficiency, unspecified: Secondary | ICD-10-CM

## 2012-11-17 DIAGNOSIS — Z1211 Encounter for screening for malignant neoplasm of colon: Secondary | ICD-10-CM

## 2012-11-17 DIAGNOSIS — R5383 Other fatigue: Secondary | ICD-10-CM

## 2012-11-17 DIAGNOSIS — R5381 Other malaise: Secondary | ICD-10-CM

## 2012-11-17 DIAGNOSIS — Z124 Encounter for screening for malignant neoplasm of cervix: Secondary | ICD-10-CM

## 2012-11-17 DIAGNOSIS — Z0000 Encounter for general adult medical examination without abnormal findings: Secondary | ICD-10-CM

## 2012-11-17 DIAGNOSIS — I10 Essential (primary) hypertension: Secondary | ICD-10-CM

## 2012-11-17 DIAGNOSIS — Z01419 Encounter for gynecological examination (general) (routine) without abnormal findings: Secondary | ICD-10-CM | POA: Insufficient documentation

## 2012-11-17 DIAGNOSIS — Z1151 Encounter for screening for human papillomavirus (HPV): Secondary | ICD-10-CM | POA: Insufficient documentation

## 2012-11-17 DIAGNOSIS — E669 Obesity, unspecified: Secondary | ICD-10-CM

## 2012-11-17 DIAGNOSIS — E8881 Metabolic syndrome: Secondary | ICD-10-CM

## 2012-11-17 LAB — LIPID PANEL
Cholesterol: 163 mg/dL (ref 0–200)
VLDL: 32 mg/dL (ref 0–40)

## 2012-11-17 LAB — CBC WITH DIFFERENTIAL/PLATELET
Basophils Relative: 0 % (ref 0–1)
Eosinophils Absolute: 0.1 10*3/uL (ref 0.0–0.7)
MCH: 29.2 pg (ref 26.0–34.0)
MCHC: 33.8 g/dL (ref 30.0–36.0)
Neutrophils Relative %: 70 % (ref 43–77)
Platelets: 321 10*3/uL (ref 150–400)
RDW: 13.3 % (ref 11.5–15.5)

## 2012-11-17 LAB — COMPREHENSIVE METABOLIC PANEL
AST: 14 U/L (ref 0–37)
Albumin: 4.3 g/dL (ref 3.5–5.2)
BUN: 8 mg/dL (ref 6–23)
Calcium: 9.6 mg/dL (ref 8.4–10.5)
Chloride: 103 mEq/L (ref 96–112)
Glucose, Bld: 95 mg/dL (ref 70–99)
Potassium: 4.7 mEq/L (ref 3.5–5.3)

## 2012-11-17 LAB — POC HEMOCCULT BLD/STL (OFFICE/1-CARD/DIAGNOSTIC): Fecal Occult Blood, POC: NEGATIVE

## 2012-11-17 LAB — HEMOGLOBIN A1C: Mean Plasma Glucose: 105 mg/dL (ref <117)

## 2012-11-17 NOTE — Patient Instructions (Addendum)
F/u in 4 month, call if you need me before  Fasting CBC, lipid, cmp, TSH, hBA1C and vit D today  Weight loss goal of 3 pounds per month  Physical activity goal of 30 minutes every day.  Results will be sent to you in "my chart"  Flu vaccine available in October  Start 1 multivitamin once daily. Blood pressure is excellent, no med change

## 2012-11-17 NOTE — Progress Notes (Signed)
  Subjective:    Patient ID: Sherry Wagner, female    DOB: March 09, 1971, 42 y.o.   MRN: [id number]  HPI Pt in for annual exam and f/u chronic issues, No new concerns Unfortunately she has regressed in healthy lifestyle changes but is ready to recommit. No current plans for IVF anymore , states if she gets pregnant then that will be good   Review of Systems See HPI Denies recent fever or chills. Denies sinus pressure, nasal congestion, ear pain or sore throat. Denies chest congestion, productive cough or wheezing. Denies chest pains, palpitations and leg swelling Denies abdominal pain, nausea, vomiting,diarrhea or constipation.   Denies dysuria, frequency, hesitancy or incontinence. Denies joint pain, swelling and limitation in mobility. Denies headaches, seizures, numbness, or tingling. Denies depression, anxiety or insomnia. Denies skin break down or rash.        Objective:   Physical Exam  Pleasant obese female, alert and oriented x 3, in no cardio-pulmonary distress. Afebrile. HEENT No facial trauma or asymetry. Sinuses non tender.  EOMI, PERTL, fundoscopic exam is normal, no hemorhage or exudate.  External ears normal, tympanic membranes clear. Oropharynx moist, no exudate, good dentition. Neck: supple, no adenopathy,JVD or thyromegaly.No bruits.  Chest: Clear to ascultation bilaterally.No crackles or wheezes. Non tender to palpation  Breast: No asymetry,no masses. No nipple discharge or inversion. No axillary or supraclavicular adenopathy  Cardiovascular system; Heart sounds normal,  S1 and  S2 ,no S3.  No murmur, or thrill. Apical beat not displaced Peripheral pulses normal.  Abdomen: Soft, non tender, no organomegaly or masses. No bruits. Bowel sounds normal. No guarding, tenderness or rebound.  Rectal:  No mass. Guaiac negative stool.  GU: External genitalia normal. No lesions. Vaginal canal normal.No discharge. Uterus normal size, no adnexal  masses, no cervical motion or adnexal tenderness.  Musculoskeletal exam: Full ROM of spine, hips , shoulders and knees. No deformity ,swelling or crepitus noted. No muscle wasting or atrophy.   Neurologic: Cranial nerves 2 to 12 intact. Power, tone ,sensation and reflexes normal throughout. No disturbance in gait. No tremor.  Skin: Intact, no ulceration, erythema , scaling or rash noted. Pigmentation normal throughout  Psych; Normal mood and affect. Judgement and concentration normal       Assessment & Plan:

## 2012-11-18 MED ORDER — ERGOCALCIFEROL 1.25 MG (50000 UT) PO CAPS: 50000.0000 [IU] | ORAL_CAPSULE | ORAL | Status: DC

## 2012-11-21 DIAGNOSIS — Z0000 Encounter for general adult medical examination without abnormal findings: Secondary | ICD-10-CM | POA: Insufficient documentation

## 2012-11-21 NOTE — Assessment & Plan Note (Signed)
Controlled, no change in medication DASH diet and commitment to daily physical activity for a minimum of 30 minutes discussed and encouraged, as a part of hypertension management. The importance of attaining a healthy weight is also discussed.  

## 2012-11-21 NOTE — Assessment & Plan Note (Signed)
Deteriorated. Patient re-educated about  the importance of commitment to a  minimum of 150 minutes of exercise per week. The importance of healthy food choices with portion control discussed. Encouraged to start a food diary, count calories and to consider  joining a support group. Sample diet sheets offered. Goals set by the patient for the next several months.    

## 2012-11-21 NOTE — Assessment & Plan Note (Signed)
Annual exam as documented. Counselled re importance of commitment to healthy lifestyle  To improve health for a[pprox 10 minutes, pt engaged and wants to recommit

## 2013-02-11 ENCOUNTER — Other Ambulatory Visit: Payer: Self-pay

## 2013-03-19 ENCOUNTER — Encounter: Payer: Self-pay | Admitting: Family Medicine

## 2013-03-19 ENCOUNTER — Ambulatory Visit (INDEPENDENT_AMBULATORY_CARE_PROVIDER_SITE_OTHER): Payer: 59 | Admitting: Family Medicine

## 2013-03-19 VITALS — BP 128/84 | HR 85 | Resp 16 | Ht 65.0 in | Wt 250.0 lb

## 2013-03-19 DIAGNOSIS — H6120 Impacted cerumen, unspecified ear: Secondary | ICD-10-CM

## 2013-03-19 DIAGNOSIS — I10 Essential (primary) hypertension: Secondary | ICD-10-CM

## 2013-03-19 DIAGNOSIS — E785 Hyperlipidemia, unspecified: Secondary | ICD-10-CM

## 2013-03-19 DIAGNOSIS — Z23 Encounter for immunization: Secondary | ICD-10-CM

## 2013-03-19 DIAGNOSIS — H6123 Impacted cerumen, bilateral: Secondary | ICD-10-CM | POA: Insufficient documentation

## 2013-03-19 DIAGNOSIS — E559 Vitamin D deficiency, unspecified: Secondary | ICD-10-CM

## 2013-03-19 DIAGNOSIS — E669 Obesity, unspecified: Secondary | ICD-10-CM

## 2013-03-19 DIAGNOSIS — E8881 Metabolic syndrome: Secondary | ICD-10-CM

## 2013-03-19 NOTE — Patient Instructions (Addendum)
F/u in 4 month, call if you need me before  Weight loss goal of 10 pounds in 4 month  Bilateral ear flush today.  No med changes at this time  You need to take daily vit D3 (OTC) 800 to 100 IU once daily  It is important that you exercise regularly at least 30 minutes 5 times a week. If you develop chest pain, have severe difficulty breathing, or feel very tired, stop exercising immediately and seek medical attention    You are referred for nutritional counseling at the hospital, you will be called by nutrtionist  Cut out butter, and reduce cheese please

## 2013-03-19 NOTE — Progress Notes (Signed)
   Subjective:    Patient ID: Sherry Wagner, female    DOB: 05/09/1970, 42 y.o.   MRN: [id number]  HPI The PT is here for follow up and re-evaluation of chronic medical conditions, medication management and review of any available recent lab and radiology data.  Preventive health is updated, specifically  Cancer screening and Immunization.   Questions or concerns regarding consultations or procedures which the PT has had in the interim are  addressed. The PT denies any adverse reactions to current medications since the last visit.  2 days ago, pt experienced muffling sensation in her ears and mild discomfort, which has resolved. Denies sinus symptoms, fever or chills Inconsistent effort at lifestyle change with no weight loss    Review of Systems See HPI Denies recent fever or chills. Denies sinus pressure, nasal congestion, ear pain or sore throat. Denies chest congestion, productive cough or wheezing. Denies chest pains, palpitations and leg swelling Denies abdominal pain, nausea, vomiting,diarrhea or constipation.   Denies dysuria, frequency, hesitancy or incontinence. Denies joint pain, swelling and limitation in mobility. Denies headaches, seizures, numbness, or tingling. Denies depression, anxiety or insomnia. Denies skin break down or rash.        Objective:   Physical Exam  Patient alert and oriented and in no cardiopulmonary distress.  HEENT: No facial asymmetry, EOMI, no sinus tenderness,  oropharynx pink and moist.  Neck supple no adenopathy.  Chest: Clear to auscultation bilaterally.  CVS: S1, S2 no murmurs, no S3.  ABD: Soft non tender. Bowel sounds normal.  Ext: No edema  MS: Adequate ROM spine, shoulders, hips and knees.  Skin: Intact, no ulcerations or rash noted.  Psych: Good eye contact, normal affect. Memory intact not anxious or depressed appearing.  CNS: CN 2-12 intact, power, tone and sensation normal throughout.       Assessment & Plan:

## 2013-03-20 DIAGNOSIS — E785 Hyperlipidemia, unspecified: Secondary | ICD-10-CM | POA: Insufficient documentation

## 2013-03-20 NOTE — Assessment & Plan Note (Signed)
Controlled, no change in medication DASH diet and commitment to daily physical activity for a minimum of 30 minutes discussed and encouraged, as a part of hypertension management. The importance of attaining a healthy weight is also discussed.  

## 2013-03-20 NOTE — Assessment & Plan Note (Signed)
Continue supplement and recheck at next visit

## 2013-03-20 NOTE — Assessment & Plan Note (Signed)
Unchanged. Patient re-educated about  the importance of commitment to a  minimum of 150 minutes of exercise per week. The importance of healthy food choices with portion control discussed. Encouraged to start a food diary, count calories and to consider  joining a support group. Sample diet sheets offered. Goals set by the patient for the next several months.    

## 2013-03-20 NOTE — Assessment & Plan Note (Signed)
Hyperlipidemia:Low fat diet discussed and encouraged.   

## 2013-03-20 NOTE — Assessment & Plan Note (Signed)
Pt aware of increased CV risk , and will work on weight loss

## 2013-03-20 NOTE — Assessment & Plan Note (Signed)
successful ear irrigation by nursing

## 2013-03-22 ENCOUNTER — Telehealth (HOSPITAL_COMMUNITY): Payer: Self-pay | Admitting: Dietician

## 2013-03-22 NOTE — Telephone Encounter (Signed)
Received referral via fax from Dr. Simpson for dx: obesity.  

## 2013-03-22 NOTE — Telephone Encounter (Signed)
Received call from pt at 1215. Pt reports that she has no days off in the remainder of December or January and work is very busy this time of year due to the nature of her job. Pt resides in [place] and works in [place]. She reports that she would only be able to come for an 8 AM appointment, as she works from 9:30-6:30 PM on Monday through Friday (earliest RD appointment is 9 AM). She reports it may be easier for her to attend an appointment in [place] given her work schedule and her work commute. Informed her of [facility] services, which pt reports would be more convenient for her. She reports she will discuss with Dr. Lodema Hong.

## 2013-03-22 NOTE — Telephone Encounter (Signed)
Called and left message at [phone].

## 2013-07-16 ENCOUNTER — Ambulatory Visit: Payer: 59 | Admitting: Family Medicine

## 2013-12-16 ENCOUNTER — Other Ambulatory Visit: Payer: Self-pay

## 2013-12-16 DIAGNOSIS — Z1231 Encounter for screening mammogram for malignant neoplasm of breast: Secondary | ICD-10-CM

## 2013-12-27 ENCOUNTER — Ambulatory Visit
Admission: RE | Admit: 2013-12-27 | Discharge: 2013-12-27 | Disposition: A | Discharge status: Home / Self Care | Payer: 59 | Source: Ambulatory Visit

## 2013-12-27 DIAGNOSIS — Z1231 Encounter for screening mammogram for malignant neoplasm of breast: Secondary | ICD-10-CM

## 2013-12-30 ENCOUNTER — Other Ambulatory Visit: Payer: Self-pay | Admitting: Family Medicine

## 2013-12-30 DIAGNOSIS — R928 Other abnormal and inconclusive findings on diagnostic imaging of breast: Secondary | ICD-10-CM

## 2014-01-04 ENCOUNTER — Other Ambulatory Visit: Payer: Self-pay | Admitting: Family Medicine

## 2014-01-04 ENCOUNTER — Other Ambulatory Visit: Payer: Self-pay

## 2014-01-04 DIAGNOSIS — R928 Other abnormal and inconclusive findings on diagnostic imaging of breast: Secondary | ICD-10-CM

## 2014-01-05 ENCOUNTER — Ambulatory Visit
Admission: RE | Admit: 2014-01-05 | Discharge: 2014-01-05 | Disposition: A | Discharge status: Home / Self Care | Payer: 59 | Source: Ambulatory Visit | Attending: Family Medicine | Admitting: Family Medicine

## 2014-01-05 DIAGNOSIS — R928 Other abnormal and inconclusive findings on diagnostic imaging of breast: Secondary | ICD-10-CM

## 2014-02-14 ENCOUNTER — Telehealth: Payer: Self-pay | Admitting: Family Medicine

## 2014-02-14 NOTE — Telephone Encounter (Signed)
Patient wants to talk with Dr Moshe Cipro about her mothers situation since she has had the stroke her mother is Sherry Wagner please call back the place where her mother is is saying its time to leave and if she goes home she will need 24 hour care patient does not know what to do

## 2014-02-15 NOTE — Telephone Encounter (Signed)
Message  Left and will call pt back in am

## 2014-02-18 NOTE — Telephone Encounter (Signed)
I spoke directly with Sherry Wagner, her Mom is now in long term care with poor prognosis , hemiplegic, from recent massive CVA, no improvement with therapy hence discontinued I expressed my real concern and I further advised that follow up in the office be cancelled , and that sh ehave the physician at the facility in [place] where she is be responsible for her mother's ongoing care. She expressed understanding and appreciation She is aware that I am available should she need me

## 2014-03-11 ENCOUNTER — Other Ambulatory Visit: Payer: Self-pay | Admitting: Family Medicine

## 2014-03-28 ENCOUNTER — Encounter: Payer: Self-pay | Admitting: Family Medicine

## 2014-03-28 ENCOUNTER — Ambulatory Visit (INDEPENDENT_AMBULATORY_CARE_PROVIDER_SITE_OTHER): Payer: 59 | Admitting: Family Medicine

## 2014-03-28 ENCOUNTER — Ambulatory Visit (INDEPENDENT_AMBULATORY_CARE_PROVIDER_SITE_OTHER): Payer: 59

## 2014-03-28 VITALS — BP 128/84 | HR 86 | Resp 16 | Ht 65.0 in | Wt 240.0 lb

## 2014-03-28 DIAGNOSIS — IMO0001 Reserved for inherently not codable concepts without codable children: Secondary | ICD-10-CM

## 2014-03-28 DIAGNOSIS — N920 Excessive and frequent menstruation with regular cycle: Secondary | ICD-10-CM

## 2014-03-28 DIAGNOSIS — Z23 Encounter for immunization: Secondary | ICD-10-CM

## 2014-03-28 DIAGNOSIS — I10 Essential (primary) hypertension: Secondary | ICD-10-CM

## 2014-03-28 DIAGNOSIS — E559 Vitamin D deficiency, unspecified: Secondary | ICD-10-CM

## 2014-03-28 DIAGNOSIS — E049 Nontoxic goiter, unspecified: Secondary | ICD-10-CM | POA: Insufficient documentation

## 2014-03-28 DIAGNOSIS — E8881 Metabolic syndrome: Secondary | ICD-10-CM

## 2014-03-28 DIAGNOSIS — E785 Hyperlipidemia, unspecified: Secondary | ICD-10-CM

## 2014-03-28 DIAGNOSIS — Z8051 Family history of malignant neoplasm of kidney: Secondary | ICD-10-CM

## 2014-03-28 DIAGNOSIS — N760 Acute vaginitis: Secondary | ICD-10-CM | POA: Insufficient documentation

## 2014-03-28 LAB — CBC WITH DIFFERENTIAL/PLATELET
BASOS ABS: 0 10*3/uL (ref 0.0–0.1)
BASOS PCT: 0 % (ref 0–1)
Eosinophils Absolute: 0 10*3/uL (ref 0.0–0.7)
Eosinophils Relative: 0 % (ref 0–5)
HEMATOCRIT: 39.8 % (ref 36.0–46.0)
Hemoglobin: 14.2 g/dL (ref 12.0–15.0)
LYMPHS PCT: 32 % (ref 12–46)
Lymphs Abs: 2.7 10*3/uL (ref 0.7–4.0)
MCH: 29.7 pg (ref 26.0–34.0)
MCHC: 35.7 g/dL (ref 30.0–36.0)
MCV: 83.3 fL (ref 78.0–100.0)
MPV: 9.1 fL — ABNORMAL LOW (ref 9.4–12.4)
Monocytes Absolute: 0.8 10*3/uL (ref 0.1–1.0)
Monocytes Relative: 9 % (ref 3–12)
NEUTROS ABS: 5 10*3/uL (ref 1.7–7.7)
NEUTROS PCT: 59 % (ref 43–77)
PLATELETS: 315 10*3/uL (ref 150–400)
RBC: 4.78 MIL/uL (ref 3.87–5.11)
RDW: 13.2 % (ref 11.5–15.5)
WBC: 8.5 10*3/uL (ref 4.0–10.5)

## 2014-03-28 LAB — COMPREHENSIVE METABOLIC PANEL
ALK PHOS: 61 U/L (ref 39–117)
ALT: 13 U/L (ref 0–35)
AST: 16 U/L (ref 0–37)
Albumin: 4.2 g/dL (ref 3.5–5.2)
BUN: 12 mg/dL (ref 6–23)
CALCIUM: 9.2 mg/dL (ref 8.4–10.5)
CHLORIDE: 100 meq/L (ref 96–112)
CO2: 27 mEq/L (ref 19–32)
Creat: 0.87 mg/dL (ref 0.50–1.10)
Glucose, Bld: 89 mg/dL (ref 70–99)
POTASSIUM: 4.1 meq/L (ref 3.5–5.3)
SODIUM: 140 meq/L (ref 135–145)
TOTAL PROTEIN: 7.5 g/dL (ref 6.0–8.3)
Total Bilirubin: 0.8 mg/dL (ref 0.2–1.2)

## 2014-03-28 LAB — LIPID PANEL
Cholesterol: 148 mg/dL (ref 0–200)
HDL: 45 mg/dL (ref >39)
LDL CALC: 88 mg/dL (ref 0–99)
Total CHOL/HDL Ratio: 3.3 Ratio
Triglycerides: 76 mg/dL (ref <150)
VLDL: 15 mg/dL (ref 0–40)

## 2014-03-28 MED ORDER — METRONIDAZOLE 500 MG PO TABS: 500.0000 mg | ORAL_TABLET | Freq: Two times a day (BID) | ORAL | Status: DC

## 2014-03-28 NOTE — Assessment & Plan Note (Signed)
Hyperlipidemia:Low fat diet discussed and encouraged.  Updated lab today 

## 2014-03-28 NOTE — Assessment & Plan Note (Signed)
Updated lab needed.  

## 2014-03-28 NOTE — Assessment & Plan Note (Signed)
Ongoing heavy menses with pelvic pan, US pelvis to further evaluate

## 2014-03-28 NOTE — Progress Notes (Signed)
Subjective:    Patient ID: Sherry Wagner, female    DOB: [birth year], 43 y.o.   MRN: [id number]  HPI  The PT is here for follow up and re-evaluation of chronic medical conditions, medication management and review of any available recent lab and radiology data.  Preventive health is updated, specifically  Cancer screening and Immunization.   Questions or concerns regarding consultations or procedures which the PT has had in the interim are  addressed. The PT denies any adverse reactions to current medications since the last visit.  C/o fishy vaginal d/c , excessively heavy menses and pelvic pain. Concerned re f/h of kidney cancer , wants re imaging, also is hypertensive,   Dealing with her mom's acute deterioration in health from massive CVA as well as could be imagined. Her older sister has stopped working to be her full time caregiver, still a very stressful situation, but she denies  Being depressed about it, and her screen is negative Plans to be more diligent re wqeight loss in the near future and she is pleasantly surprised re her current weight loss     Review of Systems See HPI Denies recent fever or chills. Denies sinus pressure, nasal congestion, ear pain or sore throat. Denies chest congestion, productive cough or wheezing. Denies chest pains, palpitations and leg swelling Denies abdominal pain, nausea, vomiting,diarrhea or constipation.   Denies dysuria, frequency, hesitancy or incontinence. Denies joint pain, swelling and limitation in mobility. Denies headaches, seizures, numbness, or tingling. Denies depression, anxiety or insomnia. Denies skin break down or rash.        Objective:   Physical Exam  BP 128/84 mmHg  Pulse 86  Resp 16  Ht 5\' 5"  (1.651 m)  Wt 240 lb (108.863 kg)  BMI 39.94 kg/m2  SpO2 98% Patient alert and oriented and in no cardiopulmonary distress.  HEENT: No facial asymmetry, EOMI,   oropharynx pink and moist.  Neck supple no JVD,  thyromegaly, TM clear.  Chest: Clear to auscultation bilaterally.  CVS: S1, S2 no murmurs, no S3.Regular rate.  ABD: Soft non tender.   Ext: No edema  MS: Adequate ROM spine, shoulders, hips and knees.  Skin: Intact, no ulcerations or rash noted.  Psych: Good eye contact, normal affect. Memory intact not anxious or depressed appearing.  CNS: CN 2-12 intact, power,  normal throughout.no focal deficits noted.       Assessment & Plan:  Essential hypertension Controlled, no change in medication DASH diet and commitment to daily physical activity for a minimum of 30 minutes discussed and encouraged, as a part of hypertension management. The importance of attaining a healthy weight is also discussed.   Vaginitis and vulvovaginitis Fishy d/c with ocassional douching. Denies dyspareunia, witll treat for presumed BV, heavy menses today, no tests sent, call in if no better  Heavy periods Ongoing heavy menses with pelvic pan, US pelvis to further evaluate  Dyslipidemia Hyperlipidemia:Low fat diet discussed and encouraged.  Updated lab today.   Metabolic syndrome The increased risk of cardiovascular disease associated with this diagnosis, and the need to consistently work on lifestyle to change this is discussed. Following  a  heart healthy diet ,commitment to 30 minutes of exercise at least 5 days per week, as well as control of blood sugar and cholesterol , and achieving a healthy weight are all the areas to be addressed .   Obesity, Class II, BMI 35-39.9, with comorbidity Improved. Pt applauded on succesful weight loss through lifestyle change, and encouraged  to continue same. Weight loss goal set for the next several months.   Vitamin D deficiency Updated lab needed   Need for prophylactic vaccination and inoculation against influenza Vaccine administered at visit.

## 2014-03-28 NOTE — Assessment & Plan Note (Signed)
Vaccine administered at visit.  

## 2014-03-28 NOTE — Assessment & Plan Note (Signed)
Improved. Pt applauded on succesful weight loss through lifestyle change, and encouraged to continue same. Weight loss goal set for the next several months.  

## 2014-03-28 NOTE — Assessment & Plan Note (Signed)
Fishy d/c with ocassional douching. Denies dyspareunia, witll treat for presumed BV, heavy menses today, no tests sent, call in if no better

## 2014-03-28 NOTE — Assessment & Plan Note (Signed)
The increased risk of cardiovascular disease associated with this diagnosis, and the need to consistently work on lifestyle to change this is discussed. Following  a  heart healthy diet ,commitment to 30 minutes of exercise at least 5 days per week, as well as control of blood sugar and cholesterol , and achieving a healthy weight are all the areas to be addressed .  

## 2014-03-28 NOTE — Assessment & Plan Note (Signed)
Controlled, no change in medication DASH diet and commitment to daily physical activity for a minimum of 30 minutes discussed and encouraged, as a part of hypertension management. The importance of attaining a healthy weight is also discussed.  

## 2014-03-28 NOTE — Patient Instructions (Addendum)
Annual physical exam in mid March, call if you need me before  Flu vaccine today  Congrats on 10 pounnd weight loss  Weight loss goal of 4 pounds per month, eat regularly and commit to daily exercise  Fasting CBC, cmp, cholesterol , HBa1C, TSH and vit D   Flagyll is prescribed, for discharge  You are referred for [place] of neck, kidneys and pelvis  Bacterial Vaginosis Bacterial vaginosis is an infection of the vagina. It happens when too many of certain germs (bacteria) grow in the vagina. HOME CARE  Take your medicine as told by your doctor.  Finish your medicine even if you start to feel better.  Do not have sex until you finish your medicine and are better.  Tell your sex partner that you have an infection. They should see their doctor for treatment.  Practice safe sex. Use condoms. Have only one sex partner. GET HELP IF:  You are not getting better after 3 days of treatment.  You have more grey fluid (discharge) coming from your vagina than before.  You have more pain than before.  You have a fever. MAKE SURE YOU:   Understand these instructions.  Will watch your condition.  Will get help right away if you are not doing well or get worse. Document Released: 01/02/2008 Document Revised: 01/13/2013 Document Reviewed: 11/04/2012 [employer] Patient Information 2015 [place], [place]. This information is not intended to replace advice given to you by your health care provider. Make sure you discuss any questions you have with your health care provider.

## 2014-03-29 ENCOUNTER — Other Ambulatory Visit: Payer: Self-pay | Admitting: Family Medicine

## 2014-03-29 DIAGNOSIS — N920 Excessive and frequent menstruation with regular cycle: Secondary | ICD-10-CM

## 2014-03-29 LAB — HEMOGLOBIN A1C
Hgb A1c MFr Bld: 5 % (ref <5.7)
MEAN PLASMA GLUCOSE: 97 mg/dL (ref <117)

## 2014-03-29 LAB — TSH: TSH: 1.265 u[IU]/mL (ref 0.350–4.500)

## 2014-03-29 LAB — VITAMIN D 25 HYDROXY (VIT D DEFICIENCY, FRACTURES): VIT D 25 HYDROXY: 22 ng/mL — AB (ref 30–100)

## 2014-03-30 ENCOUNTER — Ambulatory Visit (HOSPITAL_COMMUNITY): Payer: 59

## 2014-03-30 ENCOUNTER — Ambulatory Visit (HOSPITAL_COMMUNITY): Admission: RE | Admit: 2014-03-30 | Payer: 59 | Source: Ambulatory Visit

## 2014-04-04 ENCOUNTER — Ambulatory Visit (HOSPITAL_COMMUNITY): Payer: 59

## 2014-04-06 ENCOUNTER — Telehealth: Payer: Self-pay | Admitting: Family Medicine

## 2014-04-06 NOTE — Telephone Encounter (Addendum)
pls call nurse caring for pt , I spoke with her daughter, needs to start klonopin 0.25mg  one tab  3 times daily  # 90 refill 3  Needs I/O cath UA and c/s asap  Needs iron and ferritin added to recent lab  Needs reduced dose of synthroid, meds will be entered in pt's chart

## 2014-04-06 NOTE — Telephone Encounter (Signed)
pls get msg

## 2014-04-07 NOTE — Telephone Encounter (Signed)
See documentation in [facility] scales 09/27/45 chart

## 2014-06-15 ENCOUNTER — Other Ambulatory Visit: Payer: Self-pay | Admitting: Family Medicine

## 2014-06-17 ENCOUNTER — Other Ambulatory Visit: Payer: Self-pay | Admitting: Family Medicine

## 2014-06-20 MED ORDER — TRIAMTERENE-HCTZ 37.5-25 MG PO TABS: 1.0000 | ORAL_TABLET | Freq: Every day | ORAL | Status: DC

## 2014-06-23 ENCOUNTER — Other Ambulatory Visit: Payer: Self-pay | Admitting: Family Medicine

## 2014-07-13 ENCOUNTER — Encounter: Payer: 59 | Admitting: Family Medicine

## 2014-07-18 ENCOUNTER — Encounter: Payer: Self-pay | Admitting: Family Medicine

## 2014-07-18 ENCOUNTER — Ambulatory Visit (INDEPENDENT_AMBULATORY_CARE_PROVIDER_SITE_OTHER): Payer: 59 | Admitting: Family Medicine

## 2014-07-18 ENCOUNTER — Other Ambulatory Visit (HOSPITAL_COMMUNITY)
Admission: RE | Admit: 2014-07-18 | Discharge: 2014-07-18 | Disposition: A | Discharge status: Home / Self Care | Payer: 59 | Source: Ambulatory Visit | Attending: Family Medicine | Admitting: Family Medicine

## 2014-07-18 VITALS — BP 118/82 | HR 73 | Resp 16 | Ht 66.0 in | Wt 235.4 lb

## 2014-07-18 DIAGNOSIS — Z0000 Encounter for general adult medical examination without abnormal findings: Secondary | ICD-10-CM | POA: Diagnosis not present

## 2014-07-18 DIAGNOSIS — Z01419 Encounter for gynecological examination (general) (routine) without abnormal findings: Secondary | ICD-10-CM | POA: Diagnosis present

## 2014-07-18 DIAGNOSIS — E559 Vitamin D deficiency, unspecified: Secondary | ICD-10-CM | POA: Diagnosis not present

## 2014-07-18 DIAGNOSIS — Z124 Encounter for screening for malignant neoplasm of cervix: Secondary | ICD-10-CM

## 2014-07-18 DIAGNOSIS — I10 Essential (primary) hypertension: Secondary | ICD-10-CM | POA: Diagnosis not present

## 2014-07-18 DIAGNOSIS — Z1211 Encounter for screening for malignant neoplasm of colon: Secondary | ICD-10-CM | POA: Diagnosis not present

## 2014-07-18 LAB — POC HEMOCCULT BLD/STL (OFFICE/1-CARD/DIAGNOSTIC): Fecal Occult Blood, POC: NEGATIVE

## 2014-07-18 NOTE — Assessment & Plan Note (Signed)

## 2014-07-18 NOTE — Patient Instructions (Signed)
F/u early September, call if you need me before  Excellent labs and  Blood pressure  Continue commitment to healthy lifestyle  Weight loss goal of 20 to 25 pounds next visit  Chem 7 andvit D levels for Sept visit pls get 5 to 7 days before  PLS sched pelvic US  Thanks for choosing [facility], we consider it a privelige to serve you.

## 2014-07-19 LAB — CYTOLOGY - PAP

## 2014-07-28 ENCOUNTER — Other Ambulatory Visit: Payer: Self-pay

## 2014-07-28 DIAGNOSIS — E049 Nontoxic goiter, unspecified: Secondary | ICD-10-CM

## 2014-07-28 DIAGNOSIS — N920 Excessive and frequent menstruation with regular cycle: Secondary | ICD-10-CM

## 2014-07-28 DIAGNOSIS — I10 Essential (primary) hypertension: Secondary | ICD-10-CM

## 2014-07-28 DIAGNOSIS — Z8051 Family history of malignant neoplasm of kidney: Secondary | ICD-10-CM

## 2014-08-03 NOTE — Progress Notes (Signed)
   Subjective:    Patient ID: Sherry Wagner, female    DOB: [birth year], 44 y.o.   MRN: [id number]  HPI Patient is in for annual physical exam. No other health concerns are expressed or addressed at the visit. Recent labs, if available are reviewed. Immunization is reviewed , and  updated if needed.    Review of Systems See HPI     Objective:   Physical Exam BP 118/82 mmHg  Pulse 73  Resp 16  Ht 5\' 6"  (1.676 m)  Wt 235 lb 6.4 oz (106.777 kg)  BMI 38.01 kg/m2  SpO2 100%  LMP 07/10/2014  Pleasant well nourished female, alert and oriented x 3, in no cardio-pulmonary distress. Afebrile. HEENT No facial trauma or asymetry. Sinuses non tender.  Extra occullar muscles intact, pupils equally reactive to light. External ears normal, tympanic membranes clear. Oropharynx moist, no exudate, good dentition. Neck: supple, no adenopathy,JVD or thyromegaly.No bruits.  Chest: Clear to ascultation bilaterally.No crackles or wheezes. Non tender to palpation  Breast: No asymetry,no masses or lumps. No tenderness. No nipple discharge or inversion. No axillary or supraclavicular adenopathy  Cardiovascular system; Heart sounds normal,  S1 and  S2 ,no S3.  No murmur, or thrill. Apical beat not displaced Peripheral pulses normal.  Abdomen: Soft, non tender, no organomegaly or masses. No bruits. Bowel sounds normal. No guarding, tenderness or rebound.  Rectal:  Normal sphincter tone. No mass.No rectal masses.  Guaiac negative stool.  GU: External genitalia normal female genitalia , female distribution of hair. No lesions. Urethral meatus normal in size, no  Prolapse, no lesions visibly  Present. Bladder non tender. Vagina pink and moist , with no visible lesions ,physiologic  discharge present . Adequate pelvic support no  cystocele or rectocele noted Cervix pink and appears healthy, no lesions or ulcerations noted, no discharge noted from os Uterus enlarged, no adnexal  masses, no cervical motion or adnexal tenderness.   Musculoskeletal exam: Full ROM of spine, hips , shoulders and knees. No deformity ,swelling or crepitus noted. No muscle wasting or atrophy.   Neurologic: Cranial nerves 2 to 12 intact. Power, tone ,sensation and reflexes normal throughout. No disturbance in gait. No tremor.  Skin: Intact, no ulceration, erythema , scaling or rash noted. Pigmentation normal throughout  Psych; Normal mood and affect. Judgement and concentration normal        Assessment & Plan:

## 2014-08-04 ENCOUNTER — Ambulatory Visit (HOSPITAL_COMMUNITY)
Admission: RE | Admit: 2014-08-04 | Discharge: 2014-08-04 | Disposition: A | Discharge status: Home / Self Care | Payer: 59 | Source: Ambulatory Visit | Attending: Family Medicine | Admitting: Family Medicine

## 2014-08-04 ENCOUNTER — Other Ambulatory Visit (HOSPITAL_COMMUNITY): Payer: 59

## 2014-08-04 ENCOUNTER — Encounter: Payer: Self-pay | Admitting: Family Medicine

## 2014-08-04 DIAGNOSIS — I10 Essential (primary) hypertension: Secondary | ICD-10-CM

## 2014-08-04 DIAGNOSIS — E669 Obesity, unspecified: Secondary | ICD-10-CM | POA: Diagnosis not present

## 2014-08-04 DIAGNOSIS — E049 Nontoxic goiter, unspecified: Secondary | ICD-10-CM

## 2014-08-04 DIAGNOSIS — Z8051 Family history of malignant neoplasm of kidney: Secondary | ICD-10-CM

## 2014-08-04 DIAGNOSIS — N920 Excessive and frequent menstruation with regular cycle: Secondary | ICD-10-CM

## 2014-09-06 ENCOUNTER — Ambulatory Visit: Payer: 59 | Admitting: Orthopedic Surgery

## 2014-12-19 ENCOUNTER — Encounter: Payer: Self-pay | Admitting: Family Medicine

## 2014-12-19 ENCOUNTER — Ambulatory Visit (INDEPENDENT_AMBULATORY_CARE_PROVIDER_SITE_OTHER): Payer: 59 | Admitting: Family Medicine

## 2014-12-19 VITALS — BP 122/80 | HR 82 | Resp 16 | Ht 66.0 in | Wt 219.0 lb

## 2014-12-19 DIAGNOSIS — M7581 Other shoulder lesions, right shoulder: Secondary | ICD-10-CM | POA: Diagnosis not present

## 2014-12-19 DIAGNOSIS — I10 Essential (primary) hypertension: Secondary | ICD-10-CM

## 2014-12-19 DIAGNOSIS — E559 Vitamin D deficiency, unspecified: Secondary | ICD-10-CM

## 2014-12-19 DIAGNOSIS — Z23 Encounter for immunization: Secondary | ICD-10-CM | POA: Diagnosis not present

## 2014-12-19 DIAGNOSIS — M778 Other enthesopathies, not elsewhere classified: Secondary | ICD-10-CM | POA: Insufficient documentation

## 2014-12-19 LAB — BASIC METABOLIC PANEL WITH GFR
BUN: 12 mg/dL (ref 7–25)
CO2: 27 mmol/L (ref 20–31)
Calcium: 9.2 mg/dL (ref 8.6–10.2)
Chloride: 102 mmol/L (ref 98–110)
Creat: 0.94 mg/dL (ref 0.50–1.10)
Glucose, Bld: 90 mg/dL (ref 65–99)
Potassium: 4.3 mmol/L (ref 3.5–5.3)
Sodium: 138 mmol/L (ref 135–146)

## 2014-12-19 NOTE — Assessment & Plan Note (Signed)
Improved. Patient re-educated about  the importance of commitment to a  minimum of 150 minutes of exercise per week.  The importance of healthy food choices with portion control discussed. Encouraged to start a food diary, count calories and to consider  joining a support group. Sample diet sheets offered. Goals set by the patient for the next several months.   Weight /BMI 12/19/2014 07/18/2014 03/28/2014  WEIGHT 219 lb 235 lb 6.4 oz 240 lb  HEIGHT 5\' 6"  5\' 6"  5\' 5"   BMI 35.36 kg/m2 38.01 kg/m2 39.94 kg/m2    Current exercise per week 210 minutes.

## 2014-12-19 NOTE — Assessment & Plan Note (Signed)
Controlled, no change in medication DASH diet and commitment to daily physical activity for a minimum of 30 minutes discussed and encouraged, as a part of hypertension management. The importance of attaining a healthy weight is also discussed.  BP/Weight 12/19/2014 07/18/2014 03/28/2014 03/19/2013 11/17/2012 05/04/2012 0/15/6153  Systolic BP 794 327 614 709 295 747 340  Diastolic BP 88 82 84 84 80 82 90  Wt. (Lbs) 219 235.4 240 250 248.4 241 233  BMI 35.36 38.01 39.94 41.6 41.34 40.1 38.77

## 2014-12-19 NOTE — Patient Instructions (Signed)
F/u in 5 month, call if you need me before  Flu vaccine today  Vit D and chem 7 today  Please schedule mammogram due 01/07/2015 or after  CONGRATS on successful weight loss and excellent health habits, keep it up!  Thanks for choosing [facility], we consider it a privelige to serve you.   Call ortho for f/u of right shoulder

## 2014-12-19 NOTE — Progress Notes (Signed)
Subjective:    Patient ID: Sherry Wagner, female    DOB: 1971-01-08, 44 y.o.   MRN: [id number]  HPI   Sherry Wagner     MRN: [id number]      DOB: Jan 10, 1971   HPI Sherry Wagner is here for follow up and re-evaluation of chronic medical conditions, medication management and review of any available recent lab and radiology data.  Preventive health is updated, specifically  Cancer screening and Immunization.   Questions or concerns regarding consultations or procedures which the PT has had in the interim are  addressed. The PT denies any adverse reactions to current medications since the last visit.  Decreased mobility in right shoulder, no injury, will return to ortho for pT  ROS Denies recent fever or chills. Denies sinus pressure, nasal congestion, ear pain or sore throat. Denies chest congestion, productive cough or wheezing. Denies chest pains, palpitations and leg swelling Denies abdominal pain, nausea, vomiting,diarrhea or constipation.   Denies dysuria, frequency, hesitancy or incontinence.  Denies headaches, seizures, numbness, or tingling. Denies depression, increased  anxiety today, denies insomnia. Denies skin break down or rash.   PE  BP 138/88 mmHg  Pulse 82  Resp 16  Ht 5\' 6"  (1.676 m)  Wt 219 lb (99.338 kg)  BMI 35.36 kg/m2  SpO2 100%  Patient alert and oriented and in no cardiopulmonary distress.  HEENT: No facial asymmetry, EOMI,   oropharynx pink and moist.  Neck supple no JVD, no mass.  Chest: Clear to auscultation bilaterally.  CVS: S1, S2 no murmurs, no S3.Regular rate.  ABD: Soft non tender.   Ext: No edema  MS: Adequate ROM spine, , hips and knees.Decreased ROM right shoulder , mildly  Anxious due to her Mother's poor health which she is handling well along with her sister, not  depressed appearing.  CNS: CN 2-12 intact, power,  normal throughout.no focal deficits noted.   Assessment & Plan  Essential hypertension Controlled, no change  in medication DASH diet and commitment to daily physical activity for a minimum of 30 minutes discussed and encouraged, as a part of hypertension management. The importance of attaining a healthy weight is also discussed.  BP/Weight 12/19/2014 07/18/2014 03/28/2014 03/19/2013 11/17/2012 05/04/2012 10/05/5282  Systolic BP 132 440 102 725 366 440 347  Diastolic BP 88 82 84 84 80 82 90  Wt. (Lbs) 219 235.4 240 250 248.4 241 233  BMI 35.36 38.01 39.94 41.6 41.34 40.1 38.77        Obesity, Class II, BMI 35-39.9, with comorbidity Improved. Patient re-educated about  the importance of commitment to a  minimum of 150 minutes of exercise per week.  The importance of healthy food choices with portion control discussed. Encouraged to start a food diary, count calories and to consider  joining a support group. Sample diet sheets offered. Goals set by the patient for the next several months.   Weight /BMI 12/19/2014 07/18/2014 03/28/2014  WEIGHT 219 lb 235 lb 6.4 oz 240 lb  HEIGHT 5\' 6"  5\' 6"  5\' 5"   BMI 35.36 kg/m2 38.01 kg/m2 39.94 kg/m2    Current exercise per week 210 minutes.   Need for prophylactic vaccination and inoculation against influenza After obtaining informed consent, the vaccine is  administered by LPN.   Right shoulder tendinitis Limitation in right shoulder mobility , saw ortho 1 month ago, after several months of symptoms, will call in for PT, which I do recommend   \   Review of Systems  Objective:   Physical Exam        Assessment & Plan:

## 2014-12-19 NOTE — Assessment & Plan Note (Signed)
After obtaining informed consent, the vaccine is  administered by LPN.  

## 2014-12-19 NOTE — Assessment & Plan Note (Signed)
Limitation in right shoulder mobility , saw ortho 1 month ago, after several months of symptoms, will call in for PT, which I do recommend

## 2014-12-20 ENCOUNTER — Encounter: Payer: Self-pay | Admitting: Family Medicine

## 2014-12-20 ENCOUNTER — Other Ambulatory Visit: Payer: Self-pay | Admitting: Family Medicine

## 2014-12-20 LAB — VITAMIN D 25 HYDROXY (VIT D DEFICIENCY, FRACTURES): Vit D, 25-Hydroxy: 20 ng/mL — ABNORMAL LOW (ref 30–100)

## 2014-12-20 MED ORDER — ERGOCALCIFEROL 1.25 MG (50000 UT) PO CAPS: 50000.0000 [IU] | ORAL_CAPSULE | ORAL | Status: DC

## 2015-01-04 ENCOUNTER — Other Ambulatory Visit: Payer: Self-pay

## 2015-01-04 DIAGNOSIS — Z1231 Encounter for screening mammogram for malignant neoplasm of breast: Secondary | ICD-10-CM

## 2015-02-08 ENCOUNTER — Ambulatory Visit: Payer: 59

## 2015-03-16 ENCOUNTER — Ambulatory Visit
Admission: RE | Admit: 2015-03-16 | Discharge: 2015-03-16 | Disposition: A | Discharge status: Home / Self Care | Payer: 59 | Source: Ambulatory Visit

## 2015-03-16 DIAGNOSIS — Z1231 Encounter for screening mammogram for malignant neoplasm of breast: Secondary | ICD-10-CM

## 2015-07-02 ENCOUNTER — Other Ambulatory Visit: Payer: Self-pay | Admitting: Family Medicine

## 2015-12-25 ENCOUNTER — Other Ambulatory Visit: Payer: Self-pay | Admitting: Family Medicine

## 2016-02-01 ENCOUNTER — Ambulatory Visit (INDEPENDENT_AMBULATORY_CARE_PROVIDER_SITE_OTHER): Payer: 59 | Admitting: Family Medicine

## 2016-02-01 ENCOUNTER — Encounter: Payer: Self-pay | Admitting: Family Medicine

## 2016-02-01 VITALS — BP 110/82 | HR 93 | Ht 66.0 in | Wt 236.0 lb

## 2016-02-01 DIAGNOSIS — E669 Obesity, unspecified: Secondary | ICD-10-CM

## 2016-02-01 DIAGNOSIS — R7302 Impaired glucose tolerance (oral): Secondary | ICD-10-CM | POA: Diagnosis not present

## 2016-02-01 DIAGNOSIS — IMO0001 Reserved for inherently not codable concepts without codable children: Secondary | ICD-10-CM

## 2016-02-01 DIAGNOSIS — Z23 Encounter for immunization: Secondary | ICD-10-CM

## 2016-02-01 DIAGNOSIS — Z1231 Encounter for screening mammogram for malignant neoplasm of breast: Secondary | ICD-10-CM

## 2016-02-01 DIAGNOSIS — I10 Essential (primary) hypertension: Secondary | ICD-10-CM

## 2016-02-01 DIAGNOSIS — E559 Vitamin D deficiency, unspecified: Secondary | ICD-10-CM | POA: Diagnosis not present

## 2016-02-01 DIAGNOSIS — Z1159 Encounter for screening for other viral diseases: Secondary | ICD-10-CM

## 2016-02-01 NOTE — Patient Instructions (Addendum)
Physical exam in 4 months, call if you need me before  Fasting chem 7, HBA1C, TSH, vit D, HIV in 4 months  Mammogram due December 10 or after, we will schedule  And call you   Flu vaccine today  Weight loss goal of 12 pound to 16 pounds  Please work on good  health habits so that your health will improve. 1. Commitment to daily physical activity for 30 to 60  minutes, if you are able to do this.  2. Commitment to wise food choices. Aim for half of your  food intake to be vegetable and fruit, one quarter starchy foods, and one quarter protein. Try to eat on a regular schedule  3 meals per day, snacking between meals should be limited to vegetables or fruits or small portions of nuts. 64 ounces of water per day is generally recommended, unless you have specific health conditions, like heart failure or kidney failure where you will need to limit fluid intake.  3. Commitment to sufficient and a  good quality of physical and mental rest daily, generally between 6 to 8 hours per day.  WITH PERSISTANCE AND PERSEVERANCE, THE IMPOSSIBLE , BECOMES THE NORM! Thank you  for choosing [facility]. We consider it a privelige to serve you.  Delivering excellent health care in a caring and  compassionate way is our goal.  Partnering with you,  so that together we can achieve this goal is our strategy.

## 2016-02-04 NOTE — Progress Notes (Signed)
   Sherry Wagner     MRN: [id number]      DOB: 22-Aug-1970   HPI Sherry Wagner is here for follow up and re-evaluation of chronic medical conditions, medication management and review of any available recent lab and radiology data. Initially was for physical, but bleeding heavily Preventive health is updated, specifically  Cancer screening and Immunization.   The PT denies any adverse reactions to current medications since the last visit.  Had regained a significant amount of weight following her Sherry Wagner's death, but in past 6 weeks has joined a program through her job to re address this, and is doing very well   ROS Denies recent fever or chills. Denies sinus pressure, nasal congestion, ear pain or sore throat. Denies chest congestion, productive cough or wheezing. Denies chest pains, palpitations and leg swelling Denies abdominal pain, nausea, vomiting,diarrhea or constipation.   Denies dysuria, frequency, hesitancy or incontinence. Denies joint pain, swelling and limitation in mobility. Denies headaches, seizures, numbness, or tingling. Denies  Uncontrolled depression, anxiety or insomnia.Has worked through grief of Sherry Wagner's passing Denies skin break down or rash.   PE  BP 110/82   Pulse 93   Ht 5\' 6"  (1.676 m)   Wt 236 lb (107 kg)   SpO2 100%   BMI 38.09 kg/m   Patient alert and oriented and in no cardiopulmonary distress.  HEENT: No facial asymmetry, EOMI,   oropharynx pink and moist.  Neck supple no JVD, no mass.  Chest: Clear to auscultation bilaterally.  CVS: S1, S2 no murmurs, no S3.Regular rate.  ABD: Soft non tender.   Ext: No edema  MS: Adequate ROM spine, shoulders, hips and knees.  Skin: Intact, no ulcerations or rash noted.  Psych: Good eye contact, normal affect. Memory intact not anxious or depressed appearing.  CNS: CN 2-12 intact, power,  normal throughout.no focal deficits noted.   Assessment & Plan  Essential hypertension Controlled, no change in  medication DASH diet and commitment to daily physical activity for a minimum of 30 minutes discussed and encouraged, as a part of hypertension management. The importance of attaining a healthy weight is also discussed.  BP/Weight 02/01/2016 12/19/2014 07/18/2014 03/28/2014 03/19/2013 11/17/2012 123XX123  Systolic BP A999333 123XX123 [id number]  Diastolic BP 82 80 82 84 84 80 82  Wt. (Lbs) 236 219 235.4 240 250 248.4 241  BMI 38.09 35.36 38.01 39.94 41.6 41.34 40.1       Obesity, Class II, BMI 35-39.9, with comorbidity Deteriorated. Patient re-educated about  the importance of commitment to a  minimum of 150 minutes of exercise per week.  The importance of healthy food choices with portion control discussed. Encouraged to start a food diary, count calories and to consider  joining a support group. Sample diet sheets offered. Goals set by the patient for the next several months.   Weight /BMI 02/01/2016 12/19/2014 07/18/2014  WEIGHT 236 lb 219 lb 235 lb 6.4 oz  HEIGHT 5\' 6"  5\' 6"  5\' 6"   BMI 38.09 kg/m2 35.36 kg/m2 38.01 kg/m2      Need for prophylactic vaccination and inoculation against influenza After obtaining informed consent, the vaccine is  administered by LPN.   Vitamin D deficiency Updated lab needed at/ before next visit.

## 2016-02-04 NOTE — Assessment & Plan Note (Signed)
After obtaining informed consent, the vaccine is  administered by LPN.  

## 2016-02-04 NOTE — Assessment & Plan Note (Signed)
Updated lab needed at/ before next visit.   

## 2016-02-04 NOTE — Assessment & Plan Note (Signed)
Deteriorated. Patient re-educated about  the importance of commitment to a  minimum of 150 minutes of exercise per week.  The importance of healthy food choices with portion control discussed. Encouraged to start a food diary, count calories and to consider  joining a support group. Sample diet sheets offered. Goals set by the patient for the next several months.   Weight /BMI 02/01/2016 12/19/2014 07/18/2014  WEIGHT 236 lb 219 lb 235 lb 6.4 oz  HEIGHT 5\' 6"  5\' 6"  5\' 6"   BMI 38.09 kg/m2 35.36 kg/m2 38.01 kg/m2

## 2016-02-04 NOTE — Assessment & Plan Note (Signed)
Controlled, no change in medication DASH diet and commitment to daily physical activity for a minimum of 30 minutes discussed and encouraged, as a part of hypertension management. The importance of attaining a healthy weight is also discussed.  BP/Weight 02/01/2016 12/19/2014 07/18/2014 03/28/2014 03/19/2013 11/17/2012 [id number]  Systolic BP A999333 [id number]  Diastolic BP 82 80 82 84 84 80 82  Wt. (Lbs) 236 219 235.4 240 250 248.4 241  BMI 38.09 35.36 38.01 39.94 41.6 41.34 40.1

## 2016-04-18 ENCOUNTER — Ambulatory Visit: Payer: 59

## 2016-05-08 ENCOUNTER — Ambulatory Visit
Admission: RE | Admit: 2016-05-08 | Discharge: 2016-05-08 | Disposition: A | Discharge status: Home / Self Care | Payer: 59 | Source: Ambulatory Visit | Attending: Family Medicine | Admitting: Family Medicine

## 2016-05-08 DIAGNOSIS — Z1231 Encounter for screening mammogram for malignant neoplasm of breast: Secondary | ICD-10-CM

## 2016-06-27 ENCOUNTER — Ambulatory Visit (INDEPENDENT_AMBULATORY_CARE_PROVIDER_SITE_OTHER): Payer: 59 | Admitting: Family Medicine

## 2016-06-27 ENCOUNTER — Encounter: Payer: Self-pay | Admitting: Family Medicine

## 2016-06-27 VITALS — BP 118/82 | HR 83 | Resp 15 | Ht 66.0 in | Wt 239.0 lb

## 2016-06-27 DIAGNOSIS — E669 Obesity, unspecified: Secondary | ICD-10-CM

## 2016-06-27 DIAGNOSIS — Z1159 Encounter for screening for other viral diseases: Secondary | ICD-10-CM | POA: Diagnosis not present

## 2016-06-27 DIAGNOSIS — IMO0001 Reserved for inherently not codable concepts without codable children: Secondary | ICD-10-CM

## 2016-06-27 DIAGNOSIS — Z1211 Encounter for screening for malignant neoplasm of colon: Secondary | ICD-10-CM | POA: Diagnosis not present

## 2016-06-27 DIAGNOSIS — I10 Essential (primary) hypertension: Secondary | ICD-10-CM

## 2016-06-27 DIAGNOSIS — Z0000 Encounter for general adult medical examination without abnormal findings: Secondary | ICD-10-CM | POA: Diagnosis not present

## 2016-06-27 LAB — POC HEMOCCULT BLD/STL (OFFICE/1-CARD/DIAGNOSTIC): FECAL OCCULT BLD: POSITIVE — AB

## 2016-06-27 NOTE — Patient Instructions (Signed)
f/u in 6 months, call if you need  Me before  Weight loss goal of 12 pounds  No more vit D weekly, start OTC vit D 1000 IU   Labs today  Please work on good  health habits so that your health will improve. 1. Commitment to daily physical activity for 30 to 60  minutes, if you are able to do this.  2. Commitment to wise food choices. Aim for half of your  food intake to be vegetable and fruit, one quarter starchy foods, and one quarter protein. Try to eat on a regular schedule  3 meals per day, snacking between meals should be limited to vegetables or fruits or small portions of nuts. 64 ounces of water per day is generally recommended, unless you have specific health conditions, like heart failure or kidney failure where you will need to limit fluid intake.  3. Commitment to sufficient and a  good quality of physical and mental rest daily, generally between 6 to 8 hours per day.  WITH PERSISTANCE AND PERSEVERANCE, THE IMPOSSIBLE , BECOMES THE NORM! Thank you  for choosing [facility]. We consider it a privelige to serve you.  Delivering excellent health care in a caring and  compassionate way is our goal.  Partnering with you,  so that together we can achieve this goal is our strategy.

## 2016-06-28 DIAGNOSIS — Z1211 Encounter for screening for malignant neoplasm of colon: Secondary | ICD-10-CM | POA: Insufficient documentation

## 2016-06-28 NOTE — Assessment & Plan Note (Signed)
Annual exam as documented. Counseling done  re healthy lifestyle involving commitment to 150 minutes exercise per week, heart healthy diet, and attaining healthy weight.The importance of adequate sleep also discussed. Changes in health habits are decided on by the patient with goals and time frames  set for achieving them. Immunization and cancer screening needs are specifically addressed at this visit. 

## 2016-06-28 NOTE — Assessment & Plan Note (Signed)
Deteriorated. Patient re-educated about  the importance of commitment to a  minimum of 150 minutes of exercise per week.  The importance of healthy food choices with portion control discussed. Encouraged to start a food diary, count calories and to consider  joining a support group. Sample diet sheets offered. Goals set by the patient for the next several months.   Weight /BMI 06/27/2016 02/01/2016 12/19/2014  WEIGHT 239 lb 236 lb 219 lb  HEIGHT 5\' 6"  5\' 6"  5\' 6"   BMI 38.58 kg/m2 38.09 kg/m2 35.36 kg/m2   Weight loss goal of 12 pounds in 6 months

## 2016-06-28 NOTE — Assessment & Plan Note (Addendum)
Heme positive stool, however, just ending menses, will return 3 stool cards for additional testing, she is asymptomatic and is at avg risk for colon ca  Based on race she qualifies for screeening colonoscopy at age 46, will look at this further, she is to speak with her insurance

## 2016-06-28 NOTE — Progress Notes (Signed)
    Sherry Wagner     MRN: [id number]      DOB: 1970-07-02  HPI: Patient is in for annual physical exam. No other health concerns are expressed or addressed at the visit. Recent labs, if available are reviewed. Immunization is reviewed , and  updated if needed.   PE: BP 118/82   Pulse 83   Resp 15   Ht 5\' 6"  (1.676 m)   Wt 239 lb (108.4 kg)   LMP 06/22/2016 (Approximate)   SpO2 99%   BMI 38.58 kg/m   Pleasant  female, alert and oriented x 3, in no cardio-pulmonary distress. Afebrile. HEENT No facial trauma or asymetry. Sinuses non tender.  Extra occullar muscles intact, pupils equally reactive to light. External ears normal, tympanic membranes clear. Oropharynx moist, no exudate. Neck: supple, no adenopathy,JVD or thyromegaly.No bruits.  Chest: Clear to ascultation bilaterally.No crackles or wheezes. Non tender to palpation  Breast: No asymetry,no masses or lumps. No tenderness. No nipple discharge or inversion. No axillary or supraclavicular adenopathy  Cardiovascular system; Heart sounds normal,  S1 and  S2 ,no S3.  No murmur, or thrill. Apical beat not displaced Peripheral pulses normal.  Abdomen: Soft, non tender, no organomegaly or masses. No bruits. Bowel sounds normal. No guarding, tenderness or rebound.  Rectal:  Normal sphincter tone. No rectal mass. Guaiac positive stool.  GU: Not examined, asymptomatic  Musculoskeletal exam: Full ROM of spine, hips , shoulders and knees. No deformity ,swelling or crepitus noted. No muscle wasting or atrophy.   Neurologic: Cranial nerves 2 to 12 intact. Power, tone ,sensation and reflexes normal throughout. No disturbance in gait. No tremor.  Skin: Intact, no ulceration, erythema , scaling or rash noted. Pigmentation normal throughout  Psych; Normal mood and affect. Judgement and concentration normal   Assessment & Plan:  Annual physical exam Annual exam as documented. Counseling done  re healthy  lifestyle involving commitment to 150 minutes exercise per week, heart healthy diet, and attaining healthy weight.The importance of adequate sleep also discussed.  Changes in health habits are decided on by the patient with goals and time frames  set for achieving them. Immunization and cancer screening needs are specifically addressed at this visit.   Obesity, Class II, BMI 35-39.9, with comorbidity Deteriorated. Patient re-educated about  the importance of commitment to a  minimum of 150 minutes of exercise per week.  The importance of healthy food choices with portion control discussed. Encouraged to start a food diary, count calories and to consider  joining a support group. Sample diet sheets offered. Goals set by the patient for the next several months.   Weight /BMI 06/27/2016 02/01/2016 12/19/2014  WEIGHT 239 lb 236 lb 219 lb  HEIGHT 5\' 6"  5\' 6"  5\' 6"   BMI 38.58 kg/m2 38.09 kg/m2 35.36 kg/m2   Weight loss goal of 12 pounds in 6 months   Colon cancer screening Heme positive stool, however, just ending menses, will return 3 stool cards for additional testing, she is asymptomatic and is at avg risk for colon ca  Based on race she qualifies for screeening colonoscopy at age 28, will look at this further, she is to speak with her insurance

## 2016-07-15 ENCOUNTER — Other Ambulatory Visit (INDEPENDENT_AMBULATORY_CARE_PROVIDER_SITE_OTHER): Payer: 59

## 2016-07-15 DIAGNOSIS — Z1211 Encounter for screening for malignant neoplasm of colon: Secondary | ICD-10-CM

## 2016-07-15 LAB — HEMOCCULT GUIAC POC 1CARD (OFFICE)
FECAL OCCULT BLD: NEGATIVE
FECAL OCCULT BLD: NEGATIVE
Fecal Occult Blood, POC: NEGATIVE

## 2016-12-12 ENCOUNTER — Ambulatory Visit (INDEPENDENT_AMBULATORY_CARE_PROVIDER_SITE_OTHER): Payer: 59 | Admitting: Family Medicine

## 2016-12-12 VITALS — BP 120/80 | HR 72 | Resp 14 | Ht 65.0 in | Wt 250.0 lb

## 2016-12-12 DIAGNOSIS — E559 Vitamin D deficiency, unspecified: Secondary | ICD-10-CM | POA: Diagnosis not present

## 2016-12-12 DIAGNOSIS — IMO0001 Reserved for inherently not codable concepts without codable children: Secondary | ICD-10-CM

## 2016-12-12 DIAGNOSIS — E669 Obesity, unspecified: Secondary | ICD-10-CM

## 2016-12-12 DIAGNOSIS — I10 Essential (primary) hypertension: Secondary | ICD-10-CM

## 2016-12-12 NOTE — Patient Instructions (Addendum)
F/u in 4 months, call if you need me before  No med changes  Please seek counseling  Weight loss goal of 12 pounds  It is important that you exercise regularly at least 30 minutes 5 times a week. If you develop chest pain, have severe difficulty breathing, or feel very tired, stop exercising immediately and seek medical attention    All the best  Will send you message on frequency recommended of imaging of your kidney and thyroid

## 2016-12-15 ENCOUNTER — Encounter: Payer: Self-pay | Admitting: Family Medicine

## 2016-12-15 NOTE — Assessment & Plan Note (Addendum)
Deteriorated. Patient re-educated about  the importance of commitment to a  minimum of 150 minutes of exercise per week.  The importance of healthy food choices with portion control discussed. Encouraged to start a food diary, count calories and to consider  joining a support group. Sample diet sheets offered. Goals set by the patient for the next several months.   Weight /BMI 12/12/2016 06/27/2016 02/01/2016  WEIGHT 250 lb 239 lb 236 lb  HEIGHT 5\' 5"  5\' 6"  5\' 6"   BMI 41.6 kg/m2 38.58 kg/m2 38.09 kg/m2

## 2016-12-15 NOTE — Assessment & Plan Note (Addendum)
Controlled, no change in medication DASH diet and commitment to daily physical activity for a minimum of 30 minutes discussed and encouraged, as a part of hypertension management. The importance of attaining a healthy weight is also discussed.  BP/Weight 12/12/2016 06/27/2016 02/01/2016 12/19/2014 07/18/2014 03/28/2014 00/93/8182  Systolic BP 993 716 967 893 810 175 102  Diastolic BP 80 82 82 80 82 84 84  Wt. (Lbs) 250 239 236 219 235.4 240 250  BMI 41.6 38.58 38.09 35.36 38.01 39.94 41.6

## 2016-12-15 NOTE — Progress Notes (Signed)
Sherry Wagner     MRN: [id number]      DOB: 07/09/1970   HPI Ms. Pro is here for follow up and re-evaluation of chronic medical conditions, medication management and review of any available recent lab and radiology data.  Preventive health is updated, specifically  Cancer screening and Immunization.  Is holding off on flu vaccine at this time will get this later  States recently had an uncle to be diagnosed both with renal cell cancer and multiple myeloma , both of which her Mother had, she has questions as to how often she should have scans done of her kidney and also thyroid ultrasound which , though her neck is enlarged , she did not have a goiter when jast imaged The PT denies any adverse reactions to current medications since the last visit.  States she has "no motivation" to exercise and to eat correctly , and unfortunately has gained,  Rather than lost weight . She is experiencing see saw in her emotions , but denies being frankle depressed, she likely has notr allowed herself to fully express or actually even express her feeling s of grief and a sense of loss following the passing of her mother nearly 2 years ago, I encourage her strongly to seek counselling hrough her job and she is in agreememnt   ROS Denies recent fever or chills. Denies sinus pressure, nasal congestion, ear pain or sore throat. Denies chest congestion, productive cough or wheezing. Denies chest pains, palpitations and leg swelling Denies abdominal pain, nausea, vomiting,diarrhea or constipation.   Denies dysuria, frequency, hesitancy or incontinence. Denies joint pain, swelling and limitation in mobility. Denies headaches, seizures, numbness, or tingling. . Denies skin break down or rash.   PE  BP 120/80   Pulse 72   Resp 14   Ht _0  (1.651 m)   Wt 250 lb (113.4 kg)   SpO2 98%   BMI 41.60 kg/m    Patient alert and oriented and in no cardiopulmonary distress.  HEENT: No facial asymmetry, EOMI,    oropharynx pink and moist.  Neck supple no JVD, no mass.  Chest: Clear to auscultation bilaterally.  CVS: S1, S2 no murmurs, no S3.Regular rate.  ABD: Soft non tender.   Ext: No edema  MS: Adequate ROM spine, shoulders, hips and knees.  Skin: Intact, no ulcerations or rash noted.  Psych: Good eye contact, normal affect. Memory intact not anxious or depressed appearing.  CNS: CN 2-12 intact, power,  normal throughout.no focal deficits noted.   Assessment & Plan  Essential hypertension Controlled, no change in medication DASH diet and commitment to daily physical activity for a minimum of 30 minutes discussed and encouraged, as a part of hypertension management. The importance of attaining a healthy weight is also discussed.  BP/Weight 12/12/2016 06/27/2016 02/01/2016 12/19/2014 07/18/2014 03/28/2014 22/48/2500  Systolic BP 370 488 891 694 503 888 280  Diastolic BP 80 82 82 80 82 84 84  Wt. (Lbs) 250 239 236 219 235.4 240 250  BMI 41.6 38.58 38.09 35.36 38.01 39.94 41.6          Obesity, Class II, BMI 35-39.9, with comorbidity Deteriorated. Patient re-educated about  the importance of commitment to a  minimum of 150 minutes of exercise per week.  The importance of healthy food choices with portion control discussed. Encouraged to start a food diary, count calories and to consider  joining a support group. Sample diet sheets offered. Goals set by the patient for the  next several months.   Weight /BMI 12/12/2016 06/27/2016 02/01/2016  WEIGHT 250 lb 239 lb 236 lb  HEIGHT _0  _1  _2   BMI 41.6 kg/m2 38.58 kg/m2 38.09 kg/m2        Vitamin D deficiency Updated lab needed at/ before next visit.

## 2016-12-15 NOTE — Assessment & Plan Note (Signed)
Updated lab needed at/ before next visit.   

## 2016-12-19 ENCOUNTER — Ambulatory Visit (HOSPITAL_COMMUNITY)
Admission: RE | Admit: 2016-12-19 | Discharge: 2016-12-19 | Disposition: A | Discharge status: Home / Self Care | Payer: 59 | Source: Ambulatory Visit | Attending: Family Medicine | Admitting: Family Medicine

## 2016-12-19 DIAGNOSIS — I10 Essential (primary) hypertension: Secondary | ICD-10-CM | POA: Diagnosis present

## 2016-12-23 ENCOUNTER — Encounter: Payer: Self-pay | Admitting: Family Medicine

## 2017-01-07 ENCOUNTER — Other Ambulatory Visit: Payer: Self-pay | Admitting: Family Medicine

## 2017-04-14 ENCOUNTER — Ambulatory Visit (INDEPENDENT_AMBULATORY_CARE_PROVIDER_SITE_OTHER): Payer: 59 | Admitting: Family Medicine

## 2017-04-14 ENCOUNTER — Encounter: Payer: Self-pay | Admitting: Family Medicine

## 2017-04-14 VITALS — BP 140/86 | HR 94 | Resp 16 | Ht 65.0 in | Wt 253.0 lb

## 2017-04-14 DIAGNOSIS — Z23 Encounter for immunization: Secondary | ICD-10-CM

## 2017-04-14 DIAGNOSIS — Z111 Encounter for screening for respiratory tuberculosis: Secondary | ICD-10-CM | POA: Diagnosis not present

## 2017-04-14 DIAGNOSIS — Z1159 Encounter for screening for other viral diseases: Secondary | ICD-10-CM | POA: Diagnosis not present

## 2017-04-14 DIAGNOSIS — I10 Essential (primary) hypertension: Secondary | ICD-10-CM | POA: Diagnosis not present

## 2017-04-14 DIAGNOSIS — E559 Vitamin D deficiency, unspecified: Secondary | ICD-10-CM | POA: Diagnosis not present

## 2017-04-14 DIAGNOSIS — Z1231 Encounter for screening mammogram for malignant neoplasm of breast: Secondary | ICD-10-CM | POA: Diagnosis not present

## 2017-04-14 DIAGNOSIS — R7301 Impaired fasting glucose: Secondary | ICD-10-CM

## 2017-04-14 NOTE — Patient Instructions (Addendum)
Annual physical exam April 6, call if you need me before   [facility] will be scheduled at checkout, return after l;ab for appointment  Labs today, fasting, cBC, lipid, cmp and eGFr, hBA1C, TSH, Vit D and HIV  Form completion for foster care  Vegetable and fruit and water regular diet and exercise commitment  Therapy is excellent  HEALTH embrace this year  Fllu vaccine and PPD placement with read in 2 days  Thank you  for choosing [facility]. We consider it a privelige to serve you.  Delivering excellent health care in a caring and  compassionate way is our goal.  Partnering with you,  so that together we can achieve this goal is our strategy.

## 2017-04-16 ENCOUNTER — Ambulatory Visit: Payer: 59

## 2017-04-16 LAB — TB SKIN TEST
Induration: 0 mm
TB Skin Test: NEGATIVE

## 2017-04-16 NOTE — Progress Notes (Signed)
PPD neg  Noted

## 2017-04-17 ENCOUNTER — Encounter: Payer: Self-pay | Admitting: Family Medicine

## 2017-04-17 ENCOUNTER — Other Ambulatory Visit: Payer: Self-pay | Admitting: Family Medicine

## 2017-04-17 LAB — COMPLETE METABOLIC PANEL WITH GFR
AG Ratio: 1.3 (calc) (ref 1.0–2.5)
ALBUMIN MSPROF: 4.1 g/dL (ref 3.6–5.1)
ALT: 16 U/L (ref 6–29)
AST: 16 U/L (ref 10–35)
Alkaline phosphatase (APISO): 61 U/L (ref 33–115)
BUN: 7 mg/dL (ref 7–25)
CO2: 29 mmol/L (ref 20–32)
CREATININE: 0.84 mg/dL (ref 0.50–1.10)
Calcium: 9.3 mg/dL (ref 8.6–10.2)
Chloride: 102 mmol/L (ref 98–110)
GFR, EST AFRICAN AMERICAN: 97 mL/min/{1.73_m2} (ref >=60)
GFR, Est Non African American: 84 mL/min/{1.73_m2} (ref >=60)
GLUCOSE: 99 mg/dL (ref 65–99)
Globulin: 3.1 g/dL (calc) (ref 1.9–3.7)
Potassium: 4.5 mmol/L (ref 3.5–5.3)
Sodium: 138 mmol/L (ref 135–146)
TOTAL PROTEIN: 7.2 g/dL (ref 6.1–8.1)
Total Bilirubin: 0.7 mg/dL (ref 0.2–1.2)

## 2017-04-17 LAB — LIPID PANEL
Cholesterol: 145 mg/dL (ref <200)
HDL: 45 mg/dL — ABNORMAL LOW (ref >50)
LDL Cholesterol (Calc): 81 mg/dL (calc)
NON-HDL CHOLESTEROL (CALC): 100 mg/dL (ref <130)
Total CHOL/HDL Ratio: 3.2 (calc) (ref <5.0)
Triglycerides: 92 mg/dL (ref <150)

## 2017-04-17 LAB — TSH: TSH: 1.48 m[IU]/L

## 2017-04-17 LAB — CBC
HCT: 41.1 % (ref 35.0–45.0)
Hemoglobin: 14.1 g/dL (ref 11.7–15.5)
MCH: 29.5 pg (ref 27.0–33.0)
MCHC: 34.3 g/dL (ref 32.0–36.0)
MCV: 86 fL (ref 80.0–100.0)
MPV: 9.5 fL (ref 7.5–12.5)
PLATELETS: 301 10*3/uL (ref 140–400)
RBC: 4.78 10*6/uL (ref 3.80–5.10)
RDW: 12.8 % (ref 11.0–15.0)
WBC: 8.3 10*3/uL (ref 3.8–10.8)

## 2017-04-17 LAB — HEMOGLOBIN A1C
Hgb A1c MFr Bld: 4.9 % of total Hgb (ref <5.7)
MEAN PLASMA GLUCOSE: 94 (calc)
eAG (mmol/L): 5.2 (calc)

## 2017-04-17 LAB — HIV ANTIBODY (ROUTINE TESTING W REFLEX): HIV 1&2 Ab, 4th Generation: NONREACTIVE

## 2017-04-17 LAB — VITAMIN D 25 HYDROXY (VIT D DEFICIENCY, FRACTURES): VIT D 25 HYDROXY: 17 ng/mL — AB (ref 30–100)

## 2017-04-17 MED ORDER — ERGOCALCIFEROL 1.25 MG (50000 UT) PO CAPS: 50000.0000 [IU] | ORAL_CAPSULE | ORAL | 3 refills | Status: DC

## 2017-04-17 NOTE — Progress Notes (Signed)
Vit d prescribed for 1 year

## 2017-04-27 ENCOUNTER — Encounter: Payer: Self-pay | Admitting: Family Medicine

## 2017-04-27 NOTE — Assessment & Plan Note (Signed)
Needs to commit to weekly vit D 

## 2017-04-27 NOTE — Assessment & Plan Note (Signed)
After obtaining informed consent, the vaccine is  administered by LPN.  

## 2017-04-27 NOTE — Assessment & Plan Note (Signed)
Deteriorated. Patient re-educated about  the importance of commitment to a  minimum of 150 minutes of exercise per week.  The importance of healthy food choices with portion control discussed. Encouraged to start a food diary, count calories and to consider  joining a support group. Sample diet sheets offered. Goals set by the patient for the next several months.   Weight /BMI 04/14/2017 12/12/2016 06/27/2016  WEIGHT 253 lb 250 lb 239 lb  HEIGHT 5\' 5"  5\' 5"  5\' 6"   BMI 42.1 kg/m2 41.6 kg/m2 38.58 kg/m2

## 2017-04-27 NOTE — Progress Notes (Signed)
   Sherry Wagner     MRN: [id number]      DOB: December 24, 1970   HPI Sherry Wagner is here for follow up and re-evaluation of chronic medical conditions, medication management and review of any available recent lab and radiology data.  Preventive health is updated, specifically  Cancer screening and Immunization.   She is here with her husband , and has now decided to go to therapy for depression , as he realizes that she is experiencing prolog ed grief at the loss of her Mother 2 years after her death which she is unable to shake off.She is not suicidal or homicidal, but lacks the motivations which she needs and energy to make positive changes in her life that she desires. She has therefore continued to gain weight and she does not exercise regularly The PT denies any adverse reactions to current medications since the last visit.  Needs form completed and PPD placed for adoption purposes   ROS Denies recent fever or chills. Denies sinus pressure, nasal congestion, ear pain or sore throat. Denies chest congestion, productive cough or wheezing. Denies chest pains, palpitations and leg swelling Denies abdominal pain, nausea, vomiting,diarrhea or constipation.   Denies dysuria, frequency, hesitancy or incontinence. Denies joint pain, swelling and limitation in mobility. Denies headaches, seizures, numbness, or tingling. . Denies skin break down or rash.   PE  BP 140/86   Pulse 94   Resp 16   Ht 5\' 5"  (1.651 m)   Wt 253 lb (114.8 kg)   SpO2 96%   BMI 42.10 kg/m   Patient alert and oriented and in no cardiopulmonary distress.  HEENT: No facial asymmetry, EOMI,   oropharynx pink and moist.  Neck supple no JVD, no mass.  Chest: Clear to auscultation bilaterally.  CVS: S1, S2 no murmurs, no S3.Regular rate.  ABD: Soft non tender.   Ext: No edema  MS: Adequate ROM spine, shoulders, hips and knees.  Skin: Intact, no ulcerations or rash noted.  Psych: Good eye contact, normal affect.  Memory intact mildly  depressed appearing.  CNS: CN 2-12 intact, power,  normal throughout.no focal deficits noted.   Assessment & Plan  Essential hypertension Uncontrolled DASH diet and commitment to daily physical activity for a minimum of 30 minutes discussed and encouraged, as a part of hypertension management. The importance of attaining a healthy weight is also discussed.  BP/Weight 04/14/2017 12/12/2016 06/27/2016 02/01/2016 12/19/2014 07/18/2014 23/55/7322  Systolic BP 025 427 062 376 283 151 761  Diastolic BP 86 80 82 82 80 82 84  Wt. (Lbs) 253 250 239 236 219 235.4 240  BMI 42.1 41.6 38.58 38.09 35.36 38.01 39.94       Morbid obesity (HCC) Deteriorated. Patient re-educated about  the importance of commitment to a  minimum of 150 minutes of exercise per week.  The importance of healthy food choices with portion control discussed. Encouraged to start a food diary, count calories and to consider  joining a support group. Sample diet sheets offered. Goals set by the patient for the next several months.   Weight /BMI 04/14/2017 12/12/2016 06/27/2016  WEIGHT 253 lb 250 lb 239 lb  HEIGHT 5\' 5"  5\' 5"  5\' 6"   BMI 42.1 kg/m2 41.6 kg/m2 38.58 kg/m2      Vitamin D deficiency Needs to commit to weekly vit D  Need for immunization against influenza After obtaining informed consent, the vaccine is  administered by LPN.

## 2017-04-27 NOTE — Assessment & Plan Note (Signed)
Uncontrolled DASH diet and commitment to daily physical activity for a minimum of 30 minutes discussed and encouraged, as a part of hypertension management. The importance of attaining a healthy weight is also discussed.  BP/Weight 04/14/2017 12/12/2016 06/27/2016 02/01/2016 12/19/2014 07/18/2014 68/02/5725  Systolic BP 203 559 741 638 453 646 803  Diastolic BP 86 80 82 82 80 82 84  Wt. (Lbs) 253 250 239 236 219 235.4 240  BMI 42.1 41.6 38.58 38.09 35.36 38.01 39.94

## 2017-05-09 ENCOUNTER — Ambulatory Visit
Admission: RE | Admit: 2017-05-09 | Discharge: 2017-05-09 | Disposition: A | Discharge status: Home / Self Care | Payer: 59 | Source: Ambulatory Visit | Attending: Family Medicine | Admitting: Family Medicine

## 2017-05-09 DIAGNOSIS — Z1231 Encounter for screening mammogram for malignant neoplasm of breast: Secondary | ICD-10-CM

## 2017-08-05 ENCOUNTER — Encounter: Payer: 59 | Admitting: Family Medicine

## 2017-10-02 ENCOUNTER — Encounter: Payer: 59 | Admitting: Family Medicine

## 2017-10-14 ENCOUNTER — Other Ambulatory Visit (HOSPITAL_COMMUNITY)
Admission: RE | Admit: 2017-10-14 | Discharge: 2017-10-14 | Disposition: A | Discharge status: Home / Self Care | Payer: 59 | Source: Ambulatory Visit | Attending: Family Medicine | Admitting: Family Medicine

## 2017-10-14 ENCOUNTER — Encounter: Payer: Self-pay | Admitting: Family Medicine

## 2017-10-14 ENCOUNTER — Ambulatory Visit (INDEPENDENT_AMBULATORY_CARE_PROVIDER_SITE_OTHER): Payer: 59 | Admitting: Family Medicine

## 2017-10-14 VITALS — BP 150/84 | HR 83 | Resp 16 | Ht 65.0 in | Wt 216.0 lb

## 2017-10-14 DIAGNOSIS — Z124 Encounter for screening for malignant neoplasm of cervix: Secondary | ICD-10-CM | POA: Diagnosis not present

## 2017-10-14 DIAGNOSIS — Z13220 Encounter for screening for lipoid disorders: Secondary | ICD-10-CM | POA: Diagnosis not present

## 2017-10-14 DIAGNOSIS — Z0000 Encounter for general adult medical examination without abnormal findings: Secondary | ICD-10-CM

## 2017-10-14 DIAGNOSIS — I10 Essential (primary) hypertension: Secondary | ICD-10-CM | POA: Diagnosis not present

## 2017-10-14 NOTE — Patient Instructions (Signed)
F/U mid December,call if you need me before   Pap today   Fasting lipid, cmp and EGFr

## 2017-10-14 NOTE — Assessment & Plan Note (Signed)
Annual exam as documented. Counseling done  re healthy lifestyle involving commitment to 150 minutes exercise per week, heart healthy diet, and attaining healthy weight.The importance of adequate sleep also discussed. Changes in health habits are decided on by the patient with goals and time frames  set for achieving them. Immunization and cancer screening needs are specifically addressed at this visit. 

## 2017-10-14 NOTE — Progress Notes (Signed)
    Sherry Wagner     MRN: [id number]      DOB: 1971/02/14  HPI: Patient is in for annual physical exam. Depression screening completed as documented, patient is not depressed  She has changed her diet and has  committed to regular exercise with successful and impressive  weight loss,. Also went to one counseling session to deal with loss of her Mom and this was very successful in dealing with THIS  PE: BP (!) 150/84   Pulse 83   Resp 16   Ht 5\' 5"  (1.651 m)   Wt 216 lb (98 kg)   LMP 10/06/2017 (Exact Date)   SpO2 99%   BMI 35.94 kg/m   Pleasant  female, alert and oriented x 3, in no cardio-pulmonary distress. Afebrile. HEENT No facial trauma or asymetry. Sinuses non tender.  Extra occullar muscles intact,. External ears normal, tympanic membranes clear. Oropharynx moist, no exudate. Neck: supple, no adenopathy,JVD or thyromegaly.No bruits.  Chest: Clear to ascultation bilaterally.No crackles or wheezes. Non tender to palpation  Breast: No asymetry,no masses or lumps. No tenderness. No nipple discharge or inversion. No axillary or supraclavicular adenopathy  Cardiovascular system; Heart sounds normal,  S1 and  S2 ,no S3.  No murmur, or thrill. Apical beat not displaced Peripheral pulses normal.  Abdomen: Soft, non tender, no organomegaly or masses. No bruits. Bowel sounds normal. No guarding, tenderness or rebound.  Rectal:  3 STOOL CARDS TO BE RETURNED.  GU: External genitalia normal female genitalia , normal female distribution of hair. No lesions. Urethral meatus normal in size, no  Prolapse, no lesions visibly  Present. Bladder non tender. Vagina pink and moist , with no visible lesions , discharge present . Adequate pelvic support no  cystocele or rectocele noted Cervix pink and appears healthy, no lesions or ulcerations noted, no discharge noted from os Uterus normal size, no adnexal masses, no cervical motion or adnexal tenderness.   Musculoskeletal  exam: Full ROM of spine, hips , shoulders and knees. No deformity ,swelling or crepitus noted. No muscle wasting or atrophy.   Neurologic: Cranial nerves 2 to 12 intact. Power, tone ,sensation and reflexes normal throughout. No disturbance in gait. No tremor.  Skin: Intact, no ulceration, erythema , scaling or rash noted. Pigmentation normal throughout  Psych; Normal mood and affect. Judgement and concentration normal   Assessment & Plan:  Annual physical exam Annual exam as documented. Counseling done  re healthy lifestyle involving commitment to 150 minutes exercise per week, heart healthy diet, and attaining healthy weight.The importance of adequate sleep also discussed. Changes in health habits are decided on by the patient with goals and time frames  set for achieving them. Immunization and cancer screening needs are specifically addressed at this visit.   Essential hypertension ELEVATED, HOWEVER , PATIENT DID NOT TAKE HER MEDICATION ON THE DAY OF THE VISIT DASH diet and commitment to daily physical activity for a minimum of 30 minutes discussed and encouraged, as a part of hypertension management. The importance of attaining a healthy weight is also discussed.  BP/Weight 10/14/2017 04/14/2017 12/12/2016 06/27/2016 02/01/2016 12/19/2014 7/32/2025  Systolic BP 427 062 376 283 151 761 607  Diastolic BP 84 86 80 82 82 80 82  Wt. (Lbs) 216 253 250 239 236 219 235.4  BMI 35.94 42.1 41.6 38.58 38.09 35.36 38.01

## 2017-10-14 NOTE — Assessment & Plan Note (Signed)
ELEVATED, HOWEVER , PATIENT DID NOT TAKE HER MEDICATION ON THE DAY OF THE VISIT DASH diet and commitment to daily physical activity for a minimum of 30 minutes discussed and encouraged, as a part of hypertension management. The importance of attaining a healthy weight is also discussed.  BP/Weight 10/14/2017 04/14/2017 12/12/2016 06/27/2016 02/01/2016 12/19/2014 07/18/9045  Systolic BP 533 917 921 783 754 237 023  Diastolic BP 84 86 80 82 82 80 82  Wt. (Lbs) 216 253 250 239 236 219 235.4  BMI 35.94 42.1 41.6 38.58 38.09 35.36 38.01

## 2017-10-17 ENCOUNTER — Encounter: Payer: Self-pay | Admitting: Family Medicine

## 2017-10-17 LAB — CYTOLOGY - PAP
Diagnosis: NEGATIVE
HPV: NOT DETECTED

## 2017-10-23 ENCOUNTER — Other Ambulatory Visit: Payer: Self-pay | Admitting: Family Medicine

## 2017-10-23 ENCOUNTER — Encounter: Payer: Self-pay | Admitting: Family Medicine

## 2017-10-23 LAB — CMP14+EGFR
ALK PHOS: 50 IU/L (ref 39–117)
ALT: 18 IU/L (ref 0–32)
AST: 35 IU/L (ref 0–40)
Albumin/Globulin Ratio: 1.5 (ref 1.2–2.2)
Albumin: 4.4 g/dL (ref 3.5–5.5)
BUN/Creatinine Ratio: 12 (ref 9–23)
BUN: 11 mg/dL (ref 6–24)
Bilirubin Total: 0.7 mg/dL (ref 0.0–1.2)
CO2: 25 mmol/L (ref 20–29)
CREATININE: 0.92 mg/dL (ref 0.57–1.00)
Calcium: 9.6 mg/dL (ref 8.7–10.2)
Chloride: 100 mmol/L (ref 96–106)
GFR calc Af Amer: 86 mL/min/{1.73_m2} (ref >59)
GFR, EST NON AFRICAN AMERICAN: 75 mL/min/{1.73_m2} (ref >59)
GLOBULIN, TOTAL: 2.9 g/dL (ref 1.5–4.5)
GLUCOSE: 87 mg/dL (ref 65–99)
Potassium: 4.1 mmol/L (ref 3.5–5.2)
SODIUM: 140 mmol/L (ref 134–144)
Total Protein: 7.3 g/dL (ref 6.0–8.5)

## 2017-10-23 LAB — LIPID PANEL
CHOL/HDL RATIO: 3.3 ratio (ref 0.0–4.4)
CHOLESTEROL TOTAL: 189 mg/dL (ref 100–199)
HDL: 58 mg/dL (ref >39)
LDL CALC: 116 mg/dL — AB (ref 0–99)
TRIGLYCERIDES: 76 mg/dL (ref 0–149)
VLDL CHOLESTEROL CAL: 15 mg/dL (ref 5–40)

## 2017-10-28 DIAGNOSIS — Z1211 Encounter for screening for malignant neoplasm of colon: Secondary | ICD-10-CM

## 2017-10-28 LAB — HEMOCCULT GUIAC POC 1CARD (OFFICE)
Card #3 Fecal Occult Blood, POC: NEGATIVE
FECAL OCCULT BLD: NEGATIVE
Fecal Occult Blood, POC: NEGATIVE

## 2018-03-17 ENCOUNTER — Ambulatory Visit: Payer: 59 | Admitting: Family Medicine

## 2018-04-29 ENCOUNTER — Other Ambulatory Visit: Payer: Self-pay | Admitting: Family Medicine

## 2018-04-29 DIAGNOSIS — Z1231 Encounter for screening mammogram for malignant neoplasm of breast: Secondary | ICD-10-CM

## 2018-05-05 ENCOUNTER — Ambulatory Visit (INDEPENDENT_AMBULATORY_CARE_PROVIDER_SITE_OTHER): Payer: 59 | Admitting: Family Medicine

## 2018-05-05 ENCOUNTER — Encounter: Payer: Self-pay | Admitting: Family Medicine

## 2018-05-05 VITALS — BP 156/98 | HR 83 | Resp 15 | Ht 65.0 in | Wt 219.0 lb

## 2018-05-05 DIAGNOSIS — Z23 Encounter for immunization: Secondary | ICD-10-CM | POA: Diagnosis not present

## 2018-05-05 DIAGNOSIS — Z13220 Encounter for screening for lipoid disorders: Secondary | ICD-10-CM

## 2018-05-05 DIAGNOSIS — Z0000 Encounter for general adult medical examination without abnormal findings: Secondary | ICD-10-CM | POA: Diagnosis not present

## 2018-05-05 DIAGNOSIS — I10 Essential (primary) hypertension: Secondary | ICD-10-CM | POA: Diagnosis not present

## 2018-05-05 DIAGNOSIS — N912 Amenorrhea, unspecified: Secondary | ICD-10-CM

## 2018-05-05 MED ORDER — TRIAMTERENE-HCTZ 37.5-25 MG PO TABS: ORAL_TABLET | ORAL | 3 refills | Status: DC

## 2018-05-05 MED ORDER — TRIAMTERENE-HCTZ 37.5-25 MG PO TABS: 1.0000 | ORAL_TABLET | Freq: Every day | ORAL | 5 refills | Status: DC

## 2018-05-05 NOTE — Assessment & Plan Note (Signed)
Annual exam as documented. Counseling done  re healthy lifestyle involving commitment to 150 minutes exercise per week, heart healthy diet, and attaining healthy weight.The importance of adequate sleep also discussed. Changes in health habits are decided on by the patient with goals and time frames  set for achieving them. Immunization and cancer screening needs are specifically addressed at this visit. 

## 2018-05-05 NOTE — Assessment & Plan Note (Signed)
After obtaining informed consent, the vaccine is  administered 

## 2018-05-05 NOTE — Patient Instructions (Addendum)
F/U in  2 Months, call if you need me  Before  Increase in triamterene to ONE and A HALF tablets daily, as blood pressure is too high   It is important that you exercise regularly at least 30 minutes 5 times a week. If you develop chest pain, have severe difficulty breathing, or feel very tired, stop exercising immediately and seek medical attention    Please work on 2 pound/ month weight loss and low salt  Diet  Fasting lipid, chem7 and EGFR, CBC, TSH and quant HCG 05/18/2018   Start once daily prenatal vitamin  Speak with insurance re colonoscopy screen  Flu vaccine today  Thank you  for choosing [facility]. We consider it a privelige to serve you.  Delivering excellent health care in a caring and  compassionate way is our goal.  Partnering with you,  so that together we can achieve this goal is our strategy.

## 2018-05-05 NOTE — Progress Notes (Signed)
    Sherry Wagner     MRN: [id number]      DOB: [birth year]  HPI: Patient is in for annual physical exam. C/O irregular spotting x 2 days , which is unusual, m ay be trying to conceive. . Immunization is reviewed , and  updated   PE: BP (!) 156/98   Pulse 83   Resp 15   Ht 5\' 5"  (1.651 m)   Wt 219 lb (99.3 kg)   SpO2 96%   BMI 36.44 kg/m    Pleasant  female, alert and oriented x 3, in no cardio-pulmonary distress. Afebrile. HEENT No facial trauma or asymetry. Sinuses non tender.  Extra occullar muscles intact,. External ears normal, tympanic membranes clear. Oropharynx moist, no exudate. Neck: supple, no adenopathy,JVD or thyromegaly.No bruits.  Chest: Clear to ascultation bilaterally.No crackles or wheezes. Non tender to palpation  Breast: No asymetry,no masses or lumps. No tenderness. No nipple discharge or inversion. No axillary or supraclavicular adenopathy  Cardiovascular system; Heart sounds normal,  S1 and  S2 ,no S3.  No murmur, or thrill. Apical beat not displaced Peripheral pulses normal.  Abdomen: Soft, non tender, no organomegaly or masses. No bruits. Bowel sounds normal. No guarding, tenderness or rebound.     Musculoskeletal exam: Full ROM of spine, hips , shoulders and knees. No deformity ,swelling or crepitus noted. No muscle wasting or atrophy.   Neurologic: Cranial nerves 2 to 12 intact. Power, tone ,sensation and reflexes normal throughout. No disturbance in gait. No tremor.  Skin: Intact, no ulceration, erythema , scaling or rash noted. Pigmentation normal throughout  Psych; Normal mood and affect. Judgement and concentration normal   Assessment & Plan:   Annual physical exam Annual exam as documented. Counseling done  re healthy lifestyle involving commitment to 150 minutes exercise per week, heart healthy diet, and attaining healthy weight.The importance of adequate sleep also discussed. . Changes in health habits are  decided on by the patient with goals and time frames  set for achieving them. Immunization and cancer screening needs are specifically addressed at this visit.   Need for immunization against influenza After obtaining informed consent, the vaccine is  administered.   Morbid obesity ([place]) Obesity linked with hypertension and depression Deteriorated. Patient re-educated about  the importance of commitment to a  minimum of 150 minutes of exercise per week.  The importance of healthy food choices with portion control discussed. Encouraged to start a food diary, count calories and to consider  joining a support group. Sample diet sheets offered. Goals set by the patient for the next several months.   Weight /BMI 05/05/2018 10/14/2017 04/14/2017  WEIGHT 219 lb 216 lb 253 lb  HEIGHT 5\' 5"  5\' 5"  5\' 5"   BMI 36.44 kg/m2 35.94 kg/m2 42.1 kg/m2      Essential hypertension Uncontrolled and not at goal, increase triamterene to 1.5 tabs daily DASH diet and commitment to daily physical activity for a minimum of 30 minutes discussed and encouraged, as a part of hypertension management. The importance of attaining a healthy weight is also discussed.  BP/Weight 05/05/2018 10/14/2017 04/14/2017 12/12/2016 06/27/2016 02/01/2016 08/11/3974  Systolic BP 734 193 790 240 973 532 992  Diastolic BP 98 84 86 80 82 82 80  Wt. (Lbs) 219 216 253 250 239 236 219  BMI 36.44 35.94 42.1 41.6 38.58 38.09 35.36

## 2018-05-06 ENCOUNTER — Encounter: Payer: Self-pay | Admitting: Family Medicine

## 2018-05-06 NOTE — Assessment & Plan Note (Signed)
Uncontrolled and not at goal, increase triamterene to 1.5 tabs daily DASH diet and commitment to daily physical activity for a minimum of 30 minutes discussed and encouraged, as a part of hypertension management. The importance of attaining a healthy weight is also discussed.  BP/Weight 05/05/2018 10/14/2017 04/14/2017 12/12/2016 06/27/2016 02/01/2016 3/53/6144  Systolic BP 315 400 867 619 509 326 712  Diastolic BP 98 84 86 80 82 82 80  Wt. (Lbs) 219 216 253 250 239 236 219  BMI 36.44 35.94 42.1 41.6 38.58 38.09 35.36

## 2018-05-06 NOTE — Assessment & Plan Note (Addendum)
Obesity linked with hypertension and depression Deteriorated. Patient re-educated about  the importance of commitment to a  minimum of 150 minutes of exercise per week.  The importance of healthy food choices with portion control discussed. Encouraged to start a food diary, count calories and to consider  joining a support group. Sample diet sheets offered. Goals set by the patient for the next several months.   Weight /BMI 05/05/2018 10/14/2017 04/14/2017  WEIGHT 219 lb 216 lb 253 lb  HEIGHT 5\' 5"  5\' 5"  5\' 5"   BMI 36.44 kg/m2 35.94 kg/m2 42.1 kg/m2

## 2018-06-01 ENCOUNTER — Ambulatory Visit
Admission: RE | Admit: 2018-06-01 | Discharge: 2018-06-01 | Disposition: A | Discharge status: Home / Self Care | Payer: 59 | Source: Ambulatory Visit | Attending: Family Medicine | Admitting: Family Medicine

## 2018-06-01 DIAGNOSIS — Z1231 Encounter for screening mammogram for malignant neoplasm of breast: Secondary | ICD-10-CM

## 2018-07-08 ENCOUNTER — Ambulatory Visit: Payer: 59 | Admitting: Family Medicine

## 2018-07-15 ENCOUNTER — Ambulatory Visit: Payer: 59 | Admitting: Family Medicine

## 2018-11-03 ENCOUNTER — Encounter: Payer: 59 | Admitting: Family Medicine

## 2018-11-09 ENCOUNTER — Telehealth: Payer: Self-pay | Admitting: Family Medicine

## 2018-11-09 DIAGNOSIS — Z13220 Encounter for screening for lipoid disorders: Secondary | ICD-10-CM

## 2018-11-09 DIAGNOSIS — E559 Vitamin D deficiency, unspecified: Secondary | ICD-10-CM

## 2018-11-09 DIAGNOSIS — I10 Essential (primary) hypertension: Secondary | ICD-10-CM

## 2018-11-09 NOTE — Telephone Encounter (Signed)
Please order labs pt will go Monday AM

## 2018-11-09 NOTE — Telephone Encounter (Signed)
Labs ordered.

## 2018-11-18 ENCOUNTER — Encounter: Payer: Self-pay | Admitting: Family Medicine

## 2018-11-18 ENCOUNTER — Ambulatory Visit (INDEPENDENT_AMBULATORY_CARE_PROVIDER_SITE_OTHER): Payer: 59 | Admitting: Family Medicine

## 2018-11-18 ENCOUNTER — Other Ambulatory Visit: Payer: Self-pay

## 2018-11-18 VITALS — BP 130/94 | HR 87 | Temp 98.7°F | Ht 65.0 in | Wt 217.0 lb

## 2018-11-18 DIAGNOSIS — I10 Essential (primary) hypertension: Secondary | ICD-10-CM

## 2018-11-18 DIAGNOSIS — E559 Vitamin D deficiency, unspecified: Secondary | ICD-10-CM

## 2018-11-18 DIAGNOSIS — Z9114 Patient's other noncompliance with medication regimen: Secondary | ICD-10-CM | POA: Diagnosis not present

## 2018-11-18 DIAGNOSIS — Z0000 Encounter for general adult medical examination without abnormal findings: Secondary | ICD-10-CM | POA: Diagnosis not present

## 2018-11-18 MED ORDER — TRIAMTERENE-HCTZ 37.5-25 MG PO TABS: ORAL_TABLET | ORAL | 5 refills | Status: DC

## 2018-11-18 NOTE — Patient Instructions (Signed)
F/u in 6 months, call if you need me before  UD:THYH increase the maxzide to one and a half tabs every day  Start checking blood pressure at home once or twice  Weekly, we do recommend home BP monitoring   Congrats on exercise habits   We have a goal of 199 pounds!!  Reach out to employee assistance  As we discussed  Fasting CBC, lipid, cmp and eGFr, TSH and vit D at [facility]  Please mail in stool cards once 3 sepaated days have been collected Thanks for choosing [facility], we consider it a privelige to serve you. Social distancing. Frequent hand washing with soap and water Keeping your hands off of your face. These 3 practices will help to keep both you and your community healthy during this time. Please practice them faithfully!

## 2018-11-20 ENCOUNTER — Encounter: Payer: Self-pay | Admitting: Family Medicine

## 2018-11-20 NOTE — Progress Notes (Signed)
Sherry Wagner     MRN: [id number]      DOB: 01/01/1971  HPI: Patient is in for annual physical exam. Increased stress with covid restrictions, and raising 2 nieces who seem rejected by their biologic Mother and the turn their anger on Joss and her spouse Still exercising regularly and following vegan diet Not taking bP meds as prescribed, and bP elevated Ha weight loss goal for next several months PE: BP (!) 130/94   Pulse 87   Temp 98.7 F (37.1 C) (Temporal)   Wt 217 lb (98.4 kg)   SpO2 97%   BMI 36.11 kg/m   Pleasant  female, alert and oriented x 3, in no cardio-pulmonary distress. Afebrile. HEENT No facial trauma or asymetry. Sinuses non tender.  Extra occullar muscles intactExternal ears normal, e. Neck: supple, no adenopathy,JVD or thyromegaly.No bruits.  Chest: Clear to ascultation bilaterally.No crackles or wheezes. Non tender to palpation  Breast: No asymetry,no masses or lumps. No tenderness. No nipple discharge or inversion. No axillary or supraclavicular adenopathy  Cardiovascular system; Heart sounds normal,  S1 and  S2 ,no S3.  No murmur, or thrill. Apical beat not displaced Peripheral pulses normal.  Abdomen: Soft, non tender, no organomegaly or masses. No bruits. Bowel sounds normal. No guarding, tenderness or rebound.  Musculoskeletal exam: Full ROM of spine, hips , shoulders and knees. No deformity ,swelling or crepitus noted. No muscle wasting or atrophy.   Neurologic: Cranial nerves 2 to 12 intact. Power, tone ,sensation and reflexes normal throughout. No disturbance in gait. No tremor.  Skin: Intact, no ulceration, erythema , scaling or rash noted. Pigmentation normal throughout  Psych; Normal mood and affect. Slight anxiety when describing home life currently as above. Judgement and concentration normal   Assessment & Plan:  Annual physical exam Annual exam as documented. Counseling done  re healthy lifestyle involving  commitment to 150 minutes exercise per week, heart healthy diet, and attaining healthy weight.The importance of adequate sleep also discussed. . Changes in health habits are decided on by the patient with goals and time frames  set for achieving them. Immunization and cancer screening needs are specifically addressed at this visit.   Essential hypertension Not at goal, needs to take med as prescribed, re val in 6 months DASH diet and commitment to daily physical activity for a minimum of 30 minutes discussed and encouraged, as a part of hypertension management. The importance of attaining a healthy weight is also discussed.  BP/Weight 11/18/2018 05/05/2018 10/14/2017 04/14/2017 12/12/2016 06/27/2016 93/81/0175  Systolic BP 102 585 277 824 235 361 443  Diastolic BP 94 98 84 86 80 82 82  Wt. (Lbs) 217 219 216 253 250 239 236  BMI 36.11 36.44 35.94 42.1 41.6 38.58 38.09       Morbid obesity (HCC) Obesity linked with hypertension  Patient re-educated about  the importance of commitment to a  minimum of 150 minutes of exercise per week as able.  The importance of healthy food choices with portion control discussed, as well as eating regularly and within a 12 hour window most days. The need to choose "clean , green" food 50 to 75% of the time is discussed, as well as to make water the primary drink and set a goal of 64 ounces water daily.    Weight /BMI 11/18/2018 05/05/2018 10/14/2017  WEIGHT 217 lb 219 lb 216 lb  HEIGHT - 5\' 5"  5\' 5"   BMI 36.11 kg/m2 36.44 kg/m2 35.94 kg/m2

## 2018-11-20 NOTE — Assessment & Plan Note (Addendum)
Annual exam as documented. Counseling done  re healthy lifestyle involving commitment to 150 minutes exercise per week, heart healthy diet, and attaining healthy weight.The importance of adequate sleep also discussed. Changes in health habits are decided on by the patient with goals and time frames  set for achieving them. Immunization and cancer screening needs are specifically addressed at this visit. 

## 2018-11-20 NOTE — Assessment & Plan Note (Signed)
Not at goal, needs to take med as prescribed, re val in 6 months DASH diet and commitment to daily physical activity for a minimum of 30 minutes discussed and encouraged, as a part of hypertension management. The importance of attaining a healthy weight is also discussed.  BP/Weight 11/18/2018 05/05/2018 10/14/2017 04/14/2017 12/12/2016 06/27/2016 42/35/3614  Systolic BP 431 540 086 761 950 932 671  Diastolic BP 94 98 84 86 80 82 82  Wt. (Lbs) 217 219 216 253 250 239 236  BMI 36.11 36.44 35.94 42.1 41.6 38.58 38.09

## 2018-11-20 NOTE — Assessment & Plan Note (Signed)
Obesity linked with hypertension  Patient re-educated about  the importance of commitment to a  minimum of 150 minutes of exercise per week as able.  The importance of healthy food choices with portion control discussed, as well as eating regularly and within a 12 hour window most days. The need to choose "clean , green" food 50 to 75% of the time is discussed, as well as to make water the primary drink and set a goal of 64 ounces water daily.    Weight /BMI 11/18/2018 05/05/2018 10/14/2017  WEIGHT 217 lb 219 lb 216 lb  HEIGHT - 5\' 5"  5\' 5"   BMI 36.11 kg/m2 36.44 kg/m2 35.94 kg/m2

## 2018-12-01 ENCOUNTER — Ambulatory Visit (INDEPENDENT_AMBULATORY_CARE_PROVIDER_SITE_OTHER): Payer: 59

## 2018-12-01 ENCOUNTER — Other Ambulatory Visit: Payer: Self-pay

## 2018-12-01 DIAGNOSIS — Z23 Encounter for immunization: Secondary | ICD-10-CM | POA: Diagnosis not present

## 2018-12-02 ENCOUNTER — Encounter: Payer: Self-pay | Admitting: Family Medicine

## 2018-12-02 ENCOUNTER — Other Ambulatory Visit: Payer: Self-pay | Admitting: Family Medicine

## 2018-12-02 LAB — LIPID PANEL
Chol/HDL Ratio: 3.6 ratio (ref 0.0–4.4)
Cholesterol, Total: 203 mg/dL — ABNORMAL HIGH (ref 100–199)
HDL: 57 mg/dL (ref >39)
LDL Calculated: 127 mg/dL — ABNORMAL HIGH (ref 0–99)
Triglycerides: 93 mg/dL (ref 0–149)
VLDL Cholesterol Cal: 19 mg/dL (ref 5–40)

## 2018-12-02 LAB — CMP14+EGFR
ALT: 9 IU/L (ref 0–32)
AST: 18 IU/L (ref 0–40)
Albumin/Globulin Ratio: 1.6 (ref 1.2–2.2)
Albumin: 4.7 g/dL (ref 3.8–4.8)
Alkaline Phosphatase: 51 IU/L (ref 39–117)
BUN/Creatinine Ratio: 14 (ref 9–23)
BUN: 13 mg/dL (ref 6–24)
Bilirubin Total: 0.5 mg/dL (ref 0.0–1.2)
CO2: 22 mmol/L (ref 20–29)
Calcium: 9.6 mg/dL (ref 8.7–10.2)
Chloride: 101 mmol/L (ref 96–106)
Creatinine, Ser: 0.95 mg/dL (ref 0.57–1.00)
GFR calc Af Amer: 82 mL/min/{1.73_m2} (ref >59)
GFR calc non Af Amer: 72 mL/min/{1.73_m2} (ref >59)
Globulin, Total: 2.9 g/dL (ref 1.5–4.5)
Glucose: 104 mg/dL — ABNORMAL HIGH (ref 65–99)
Potassium: 4.3 mmol/L (ref 3.5–5.2)
Sodium: 138 mmol/L (ref 134–144)
Total Protein: 7.6 g/dL (ref 6.0–8.5)

## 2018-12-02 LAB — VITAMIN D 25 HYDROXY (VIT D DEFICIENCY, FRACTURES): Vit D, 25-Hydroxy: 24 ng/mL — ABNORMAL LOW (ref 30.0–100.0)

## 2018-12-02 LAB — CBC
Hematocrit: 44.3 % (ref 34.0–46.6)
Hemoglobin: 14.9 g/dL (ref 11.1–15.9)
MCH: 30.1 pg (ref 26.6–33.0)
MCHC: 33.6 g/dL (ref 31.5–35.7)
MCV: 90 fL (ref 79–97)
Platelets: 255 10*3/uL (ref 150–450)
RBC: 4.95 x10E6/uL (ref 3.77–5.28)
RDW: 13 % (ref 11.7–15.4)
WBC: 7.3 10*3/uL (ref 3.4–10.8)

## 2018-12-02 LAB — TSH: TSH: 2.53 u[IU]/mL (ref 0.450–4.500)

## 2018-12-02 MED ORDER — ERGOCALCIFEROL 1.25 MG (50000 UT) PO CAPS: 50000.0000 [IU] | ORAL_CAPSULE | ORAL | 1 refills | Status: DC

## 2019-05-24 ENCOUNTER — Ambulatory Visit: Payer: 59 | Admitting: Family Medicine

## 2019-06-16 ENCOUNTER — Ambulatory Visit (HOSPITAL_COMMUNITY)
Admission: EM | Admit: 2019-06-16 | Discharge: 2019-06-16 | Disposition: A | Discharge status: Home / Self Care | Payer: 59 | Attending: Family Medicine | Admitting: Family Medicine

## 2019-06-16 ENCOUNTER — Encounter (HOSPITAL_COMMUNITY): Payer: Self-pay

## 2019-06-16 ENCOUNTER — Ambulatory Visit (INDEPENDENT_AMBULATORY_CARE_PROVIDER_SITE_OTHER): Payer: 59

## 2019-06-16 ENCOUNTER — Other Ambulatory Visit: Payer: Self-pay

## 2019-06-16 DIAGNOSIS — M545 Low back pain: Secondary | ICD-10-CM

## 2019-06-16 DIAGNOSIS — R52 Pain, unspecified: Secondary | ICD-10-CM

## 2019-06-16 DIAGNOSIS — M5442 Lumbago with sciatica, left side: Secondary | ICD-10-CM

## 2019-06-16 DIAGNOSIS — R609 Edema, unspecified: Secondary | ICD-10-CM | POA: Diagnosis not present

## 2019-06-16 DIAGNOSIS — M25552 Pain in left hip: Secondary | ICD-10-CM

## 2019-06-16 MED ORDER — KETOROLAC TROMETHAMINE 60 MG/2ML IM SOLN
INTRAMUSCULAR | Status: AC
  Filled 2019-06-16: qty 2

## 2019-06-16 MED ORDER — KETOROLAC TROMETHAMINE 60 MG/2ML IM SOLN
60.0000 mg | Freq: Once | INTRAMUSCULAR | Status: AC
  Administered 2019-06-16: 60 mg via INTRAMUSCULAR

## 2019-06-16 MED ORDER — PREDNISONE 10 MG (21) PO TBPK: ORAL_TABLET | Freq: Every day | ORAL | 0 refills | Status: AC

## 2019-06-16 MED ORDER — MELOXICAM 7.5 MG PO TABS: 7.5000 mg | ORAL_TABLET | Freq: Every day | ORAL | 0 refills | Status: DC

## 2019-06-16 MED ORDER — DEXAMETHASONE SODIUM PHOSPHATE 10 MG/ML IJ SOLN
INTRAMUSCULAR | Status: AC
  Filled 2019-06-16: qty 1

## 2019-06-16 MED ORDER — CYCLOBENZAPRINE HCL 10 MG PO TABS: 10.0000 mg | ORAL_TABLET | Freq: Two times a day (BID) | ORAL | 0 refills | Status: DC | PRN

## 2019-06-16 MED ORDER — DEXAMETHASONE SODIUM PHOSPHATE 10 MG/ML IJ SOLN
10.0000 mg | Freq: Once | INTRAMUSCULAR | Status: AC
  Administered 2019-06-16: 10 mg via INTRAMUSCULAR

## 2019-06-16 NOTE — ED Provider Notes (Signed)
[place]    CSN: [id number] Arrival date & time: 06/16/19  1705      History   Chief Complaint Chief Complaint  Patient presents with  . Back Pain  . Hip Pain    HPI Sherry Wagner is a 49 y.o. female.   Reports low back and left hip pain that radiates down her leg. Denies any injury. Reports that she had a massage on 06/11/19, and that she has been house hunting all weekend and during this week. Reports that she was not told to hydrate after her massage. Reports that she has had a history of sciatica on the right side, and this feels similar but is much more intense. Has taken 400 mg ibuprofen this morning. Reports that she was unable to sleep or get comfortable last night. Denies headache, body aches, nausea, vomiting, diarrhea, fever, rash, other symptoms.  ROS per HPI  The history is provided by the patient.  Back Pain Hip Pain    Past Medical History:  Diagnosis Date  . Hypertension   . Obesity     Patient Active Problem List   Diagnosis Date Noted  . Annual physical exam 06/27/2016  . Need for immunization against influenza 12/19/2014  . Vitamin D deficiency 06/06/2011  . Morbid obesity ([place]) 01/28/2008  . Essential hypertension 01/28/2008    History reviewed. No pertinent surgical history.  OB History   No obstetric history on file.      Home Medications    Prior to Admission medications   Medication Sig Start Date End Date Taking? Authorizing Provider  triamterene-hydrochlorothiazide (MAXZIDE-25) 37.5-25 MG tablet Take one and a half tablet every morning for  Blood pressure 11/18/18  Yes Fayrene Helper, MD  cyclobenzaprine (FLEXERIL) 10 MG tablet Take 1 tablet (10 mg total) by mouth 2 (two) times daily as needed for muscle spasms. 06/16/19   Faustino Congress, NP  ergocalciferol (VITAMIN D2) 1.25 MG (50000 UT) capsule Take 1 capsule (50,000 Units total) by mouth once a week. One capsule once weekly 12/02/18   Fayrene Helper, MD    meloxicam (MOBIC) 7.5 MG tablet Take 1 tablet (7.5 mg total) by mouth daily. 06/16/19   Faustino Congress, NP  predniSONE (STERAPRED UNI-PAK 21 TAB) 10 MG (21) TBPK tablet Take by mouth daily for 6 days. Take 6 tablets on day 1, 5 tablets on day 2, 4 tablets on day 3, 3 tablets on day 4, 2 tablets on day 5, 1 tablet on day 6 06/16/19 06/22/19  Faustino Congress, NP    Family History Family History  Problem Relation Age of Onset  . Cancer Mother 81       renal cell ca   . Thyroid disease Mother 57  . Hypertension Mother 49  . Stroke Mother     Social History Social History   Tobacco Use  . Smoking status: Never Smoker  . Smokeless tobacco: Never Used  Substance Use Topics  . Alcohol use: No  . Drug use: No     Allergies   Patient has no known allergies.   Review of Systems Review of Systems  Musculoskeletal: Positive for back pain.     Physical Exam Triage Vital Signs ED Triage Vitals  Enc Vitals Group     BP 06/16/19 1744 137/89     Pulse Rate 06/16/19 1744 99     Resp 06/16/19 1744 18     Temp 06/16/19 1744 98.6 F (37 C)     Temp Source  06/16/19 1744 Oral     SpO2 06/16/19 1744 99 %     Weight --      Height --      Head Circumference --      Peak Flow --      Pain Score 06/16/19 1806 8     Pain Loc --      Pain Edu? --      Excl. in [place]? --    No data found.  Updated Vital Signs BP 137/89 (BP Location: Right Arm)   Pulse 99   Temp 98.6 F (37 C) (Oral)   Resp 18   LMP 06/07/2019   SpO2 99%   Visual Acuity Right Eye Distance:   Left Eye Distance:   Bilateral Distance:    Right Eye Near:   Left Eye Near:    Bilateral Near:     Physical Exam Vitals and nursing note reviewed.  Constitutional:      General: She is not in acute distress.    Appearance: She is well-developed.  HENT:     Head: Normocephalic and atraumatic.  Eyes:     Conjunctiva/sclera: Conjunctivae normal.  Cardiovascular:     Rate and Rhythm: Normal rate and regular  rhythm.     Heart sounds: Normal heart sounds. No murmur.  Pulmonary:     Effort: Pulmonary effort is normal. No respiratory distress.     Breath sounds: Normal breath sounds.  Abdominal:     Palpations: Abdomen is soft.     Tenderness: There is no abdominal tenderness.  Musculoskeletal:        General: Swelling and tenderness present.     Cervical back: Neck supple.     Left hip: Tenderness present. Decreased range of motion.       Legs:     Comments: Red is area of tenderness to the back believes area of tenderness and swelling to the left hip. No bony tenderness noted.  Skin:    General: Skin is warm and dry.     Capillary Refill: Capillary refill takes less than 2 seconds.  Neurological:     General: No focal deficit present.     Mental Status: She is alert and oriented to person, place, and time.  Psychiatric:        Mood and Affect: Mood normal.        Behavior: Behavior normal.      UC Treatments / Results  Labs (all labs ordered are listed, but only abnormal results are displayed) Labs Reviewed - No data to display  EKG   Radiology DG Lumbar Spine Complete  Result Date: 06/16/2019 CLINICAL DATA:  Low back pain EXAM: LUMBAR SPINE - COMPLETE 4+ VIEW COMPARISON:  None. FINDINGS: Alignment within normal limits. Vertebral body heights are within normal limits. Mild disc space narrowing at L3-L4. Degenerative osteophytes at all levels. Mild facet degenerative changes of the lower lumbar spine IMPRESSION: Mild degenerative changes.  No acute osseous abnormality Electronically Signed   By: Donavan Foil M.D.   On: 06/16/2019 19:16   DG Hip Unilat With Pelvis 2-3 Views Left  Result Date: 06/16/2019 CLINICAL DATA:  Left hip pain EXAM: DG HIP (WITH OR WITHOUT PELVIS) 2-3V LEFT COMPARISON:  None. FINDINGS: SI joints are non widened. Pubic symphysis and rami are intact. Both femoral heads project in joint. No fracture or malalignment. Joint space is maintained IMPRESSION:  Negative. Electronically Signed   By: Donavan Foil M.D.   On: 06/16/2019 19:16  Procedures Procedures (including critical care time)  Medications Ordered in UC Medications  ketorolac (TORADOL) injection 60 mg (60 mg Intramuscular Given 06/16/19 1831)  dexamethasone (DECADRON) injection 10 mg (10 mg Intramuscular Given 06/16/19 1832)    Initial Impression / Assessment and Plan / UC Course  I have reviewed the triage vital signs and the nursing notes.  Pertinent labs & imaging results that were available during my care of the patient were reviewed by me and considered in my medical decision making (see chart for details).     Low back and left hip pain with gradual onset x4 days. 10 mg Decadron IM given in office today, 60 mg Toradol IM given in office today. Lumbar spine and hip x-ray are negative today.  If symptoms are still persisting, follow-up with primary care or with orthopedics as needed.  Go to the ER for sudden loss of bowel control, other concerning symptoms. Final Clinical Impressions(s) / UC Diagnoses   Final diagnoses:  Acute left-sided low back pain with left-sided sciatica  Left hip pain  Swelling  Pain     Discharge Instructions     Take the ibuprofen as prescribed.  Apply ice packs 2-3 times a day for up to 20 minutes each.   Follow up with your primary care provider or an orthopedist if you symptoms continue or worsen;  Or if you develop new symptoms, such as numbness, tingling, or weakness.       ED Prescriptions    Medication Sig Dispense Auth. Provider   predniSONE (STERAPRED UNI-PAK 21 TAB) 10 MG (21) TBPK tablet Take by mouth daily for 6 days. Take 6 tablets on day 1, 5 tablets on day 2, 4 tablets on day 3, 3 tablets on day 4, 2 tablets on day 5, 1 tablet on day 6 21 tablet Faustino Congress, NP   meloxicam (MOBIC) 7.5 MG tablet Take 1 tablet (7.5 mg total) by mouth daily. 20 tablet Faustino Congress, NP   cyclobenzaprine (FLEXERIL) 10 MG tablet  Take 1 tablet (10 mg total) by mouth 2 (two) times daily as needed for muscle spasms. 20 tablet Faustino Congress, NP     PDMP not reviewed this encounter.   Faustino Congress, NP 06/16/19 1925

## 2019-06-16 NOTE — ED Triage Notes (Signed)
Pt c/o acute onset left hip/outer thigh pain two days ago. States pain is preventing her from moving leg or ambulating. Also c/o left lumbar pain.  Pt states she had massage on Saturday. Denies any injury/trauma to hip/back.  Pt denies any numbness to extremities.

## 2019-06-16 NOTE — Discharge Instructions (Addendum)
Take the ibuprofen as prescribed.  Apply ice packs 2-3 times a day for up to 20 minutes each.   Follow up with your primary care provider or an orthopedist if you symptoms continue or worsen;  Or if you develop new symptoms, such as numbness, tingling, or weakness.

## 2019-07-29 ENCOUNTER — Other Ambulatory Visit: Payer: Self-pay | Admitting: Family Medicine

## 2019-07-29 DIAGNOSIS — Z1231 Encounter for screening mammogram for malignant neoplasm of breast: Secondary | ICD-10-CM

## 2019-08-11 ENCOUNTER — Other Ambulatory Visit: Payer: Self-pay

## 2019-08-11 ENCOUNTER — Ambulatory Visit
Admission: RE | Admit: 2019-08-11 | Discharge: 2019-08-11 | Disposition: A | Discharge status: Home / Self Care | Payer: 59 | Source: Ambulatory Visit | Attending: Family Medicine | Admitting: Family Medicine

## 2019-08-11 DIAGNOSIS — Z1231 Encounter for screening mammogram for malignant neoplasm of breast: Secondary | ICD-10-CM

## 2019-11-22 ENCOUNTER — Encounter: Payer: Self-pay | Admitting: Family Medicine

## 2019-11-22 ENCOUNTER — Ambulatory Visit (INDEPENDENT_AMBULATORY_CARE_PROVIDER_SITE_OTHER): Payer: 59 | Admitting: Family Medicine

## 2019-11-22 ENCOUNTER — Other Ambulatory Visit: Payer: Self-pay

## 2019-11-22 VITALS — BP 161/110 | HR 89 | Resp 16 | Ht 65.0 in | Wt 237.1 lb

## 2019-11-22 DIAGNOSIS — I10 Essential (primary) hypertension: Secondary | ICD-10-CM | POA: Diagnosis not present

## 2019-11-22 DIAGNOSIS — Z23 Encounter for immunization: Secondary | ICD-10-CM | POA: Diagnosis not present

## 2019-11-22 DIAGNOSIS — E559 Vitamin D deficiency, unspecified: Secondary | ICD-10-CM

## 2019-11-22 DIAGNOSIS — Z1211 Encounter for screening for malignant neoplasm of colon: Secondary | ICD-10-CM | POA: Diagnosis not present

## 2019-11-22 DIAGNOSIS — Z0000 Encounter for general adult medical examination without abnormal findings: Secondary | ICD-10-CM

## 2019-11-22 DIAGNOSIS — Z0001 Encounter for general adult medical examination with abnormal findings: Secondary | ICD-10-CM

## 2019-11-22 DIAGNOSIS — Z1159 Encounter for screening for other viral diseases: Secondary | ICD-10-CM

## 2019-11-22 DIAGNOSIS — Z13220 Encounter for screening for lipoid disorders: Secondary | ICD-10-CM

## 2019-11-22 NOTE — Assessment & Plan Note (Signed)
  Patient re-educated about  the importance of commitment to a  minimum of 150 minutes of exercise per week as able.  The importance of healthy food choices with portion control discussed, as well as eating regularly and within a 12 hour window most days. The need to choose "clean , green" food 50 to 75% of the time is discussed, as well as to make water the primary drink and set a goal of 64 ounces water daily.    Weight /BMI 11/22/2019 11/18/2018 05/05/2018  WEIGHT 237 lb 1.3 oz 217 lb 219 lb  HEIGHT 5\' 5"  5\' 5"  5\' 5"   BMI 39.45 kg/m2 36.11 kg/m2 36.44 kg/m2

## 2019-11-22 NOTE — Progress Notes (Signed)
Sherry Wagner     MRN: [id number]      DOB: 08-03-1970  HPI: Patient is in for annual physical exam. Not exercising regularly and has gained weight , has 4 school aghed " daughters" sh is caring for , ranges 11 thru 17 Not taking BP medication on schedule . Immunization is reviewed , and  updated    PE: BP (!) 161/110   Pulse 89   Resp 16   Ht 5\' 5"  (1.651 m)   Wt 237 lb 1.3 oz (107.5 kg)   SpO2 98%   BMI 39.45 kg/m   Pleasant  female, alert and oriented x 3, in no cardio-pulmonary distress. Afebrile. HEENT No facial trauma or asymetry. Sinuses non tender.  Extra occullar muscles intact.. External ears normal, . Neck: supple, no adenopathy,JVD or thyromegaly.No bruits.  Chest: Clear to ascultation bilaterally.No crackles or wheezes. Non tender to palpation  Breast: No asymetry,no masses or lumps. No tenderness. No nipple discharge or inversion. No axillary or supraclavicular adenopathy  Cardiovascular system; Heart sounds normal,  S1 and  S2 ,no S3.  No murmur, or thrill. Apical beat not displaced Peripheral pulses normal.  Abdomen: Soft, non tender, no organomegaly or masses. No bruits. Bowel sounds normal. No guarding, tenderness or rebound.      Musculoskeletal exam: Full ROM of spine, hips , shoulders and knees. No deformity ,swelling or crepitus noted. No muscle wasting or atrophy.   Neurologic: Cranial nerves 2 to 12 intact. Power, tone ,sensation and reflexes normal throughout. No disturbance in gait. No tremor.  Skin: Intact, no ulceration, erythema , scaling or rash noted. Pigmentation normal throughout  Psych; Normal mood and affect. Judgement and concentration normal   Assessment & Plan:  Annual physical exam Annual exam as documented. Counseling done  re healthy lifestyle involving commitment to 150 minutes exercise per week, heart healthy diet, and attaining healthy weight.The importance of adequate sleep also  discussed. Regular seat belt use and home safety, is also discussed. Changes in health habits are decided on by the patient with goals and time frames  set for achieving them. Immunization and cancer screening needs are specifically addressed at this visit.   Essential hypertension Uncontrolled and n on compliant [place] triamterene to 1.5 EKG DASH diet and commitment to daily physical activity for a minimum of 30 minutes discussed and encouraged, as a part of hypertension management. The importance of attaining a healthy weight is also discussed.  BP/Weight 11/22/2019 06/16/2019 11/18/2018 05/05/2018 10/14/2017 0/06/5007 06/14/1827  Systolic BP 937 169 678 938 101 751 025  Diastolic BP 852 89 94 98 84 86 80  Wt. (Lbs) 237.08 - 217 219 216 253 250  BMI 39.45 - 36.11 36.44 35.94 42.1 41.6     F/U in 2 weeks  Need for Tdap vaccination After obtaining informed consent, the vaccine is  administered , with no adverse effect noted at the time of administration.   Morbid obesity ([place])  Patient re-educated about  the importance of commitment to a  minimum of 150 minutes of exercise per week as able.  The importance of healthy food choices with portion control discussed, as well as eating regularly and within a 12 hour window most days. The need to choose "clean , green" food 50 to 75% of the time is discussed, as well as to make water the primary drink and set a goal of 64 ounces water daily.    Weight /BMI 11/22/2019 11/18/2018 05/05/2018  WEIGHT 237 lb 1.3  oz 217 lb 219 lb  HEIGHT 5\' 5"  5\' 5"  5\' 5"   BMI 39.45 kg/m2 36.11 kg/m2 36.44 kg/m2

## 2019-11-22 NOTE — Assessment & Plan Note (Signed)

## 2019-11-22 NOTE — Assessment & Plan Note (Signed)
Uncontrolled and n on compliant [place] triamterene to 1.5 EKG DASH diet and commitment to daily physical activity for a minimum of 30 minutes discussed and encouraged, as a part of hypertension management. The importance of attaining a healthy weight is also discussed.  BP/Weight 11/22/2019 06/16/2019 11/18/2018 05/05/2018 10/14/2017 05/13/4268 09/07/3760  Systolic BP 831 517 616 073 710 626 948  Diastolic BP 546 89 94 98 84 86 80  Wt. (Lbs) 237.08 - 217 219 216 253 250  BMI 39.45 - 36.11 36.44 35.94 42.1 41.6     F/U in 2 weeks

## 2019-11-22 NOTE — Patient Instructions (Addendum)
Follow-up in office with MD in 2 weeks call if you need me sooner.  Blood pressure significantly elevated and uncontrolled.  You need to take your prescribed medication at the same time every day.  Increased dose of your triamterene from 1-1/2 tablets once daily to 2 tablets once daily starting today.  Take tablets at the same time every day.  Tdap today.  EKG in office today.  Labs today CBC lipid CMP and EGFR TSH vitamin D and hepatitis C screen.  You are referred for screening colonoscopy.   It is important that you exercise regularly at least 30 minutes 5 times a week. If you develop chest pain, have severe difficulty breathing, or feel very tired, stop exercising immediately and seek medical attention  Think about what you will eat, plan ahead. Choose " clean, green, fresh or frozen" over canned, processed or packaged foods which are more sugary, salty and fatty. 70 to 75% of food eaten should be vegetables and fruit. Three meals at set times with snacks allowed between meals, but they must be fruit or vegetables. Aim to eat over a 12 hour period , example 7 am to 7 pm, and STOP after  your last meal of the day. Drink water,generally about 64 ounces per day, no other drink is as healthy. Fruit juice is best enjoyed in a healthy way, by EATING the fruit.  Thanks for choosing [facility], we consider it a privelige to serve you.   

## 2019-11-22 NOTE — Assessment & Plan Note (Signed)
After obtaining informed consent, the vaccine is  administered , with no adverse effect noted at the time of administration.  

## 2019-11-28 ENCOUNTER — Encounter: Payer: Self-pay | Admitting: Family Medicine

## 2019-12-06 ENCOUNTER — Encounter: Payer: Self-pay | Admitting: Family Medicine

## 2019-12-06 ENCOUNTER — Ambulatory Visit (INDEPENDENT_AMBULATORY_CARE_PROVIDER_SITE_OTHER): Payer: 59 | Admitting: Family Medicine

## 2019-12-06 ENCOUNTER — Other Ambulatory Visit: Payer: Self-pay

## 2019-12-06 VITALS — BP 124/80 | HR 96 | Ht 65.0 in | Wt 232.0 lb

## 2019-12-06 DIAGNOSIS — I10 Essential (primary) hypertension: Secondary | ICD-10-CM

## 2019-12-06 DIAGNOSIS — Z23 Encounter for immunization: Secondary | ICD-10-CM

## 2019-12-06 DIAGNOSIS — E559 Vitamin D deficiency, unspecified: Secondary | ICD-10-CM | POA: Diagnosis not present

## 2019-12-06 MED ORDER — TRIAMTERENE-HCTZ 75-50 MG PO TABS: 1.0000 | ORAL_TABLET | Freq: Every day | ORAL | 3 refills | Status: DC

## 2019-12-06 NOTE — Patient Instructions (Signed)
F//u in office with mD in 4 months, re evaluate weight and blood pressure, call if you need me sooner  Excellent blood pressure, keep taking medication every day as prescribed  Congrats on weight loss with lifestyle change, keep it up  NEED LABS this week PLEASE  Flu vaccine today  It is important that you exercise regularly at least 30 minutes 5 times a week. If you develop chest pain, have severe difficulty breathing, or feel very tired, stop exercising immediately and seek medical attention  Think about what you will eat, plan ahead. Choose " clean, green, fresh or frozen" over canned, processed or packaged foods which are more sugary, salty and fatty. 70 to 75% of food eaten should be vegetables and fruit. Three meals at set times with snacks allowed between meals, but they must be fruit or vegetables. Aim to eat over a 12 hour period , example 7 am to 7 pm, and STOP after  your last meal of the day. Drink water,generally about 64 ounces per day, no other drink is as healthy. Fruit juice is best enjoyed in a healthy way, by EATING the fruit. Thanks for choosing [facility], we consider it a privelige to serve you.

## 2019-12-08 ENCOUNTER — Other Ambulatory Visit: Payer: Self-pay

## 2019-12-08 DIAGNOSIS — Z13220 Encounter for screening for lipoid disorders: Secondary | ICD-10-CM

## 2019-12-08 DIAGNOSIS — Z1159 Encounter for screening for other viral diseases: Secondary | ICD-10-CM

## 2019-12-08 DIAGNOSIS — E559 Vitamin D deficiency, unspecified: Secondary | ICD-10-CM

## 2019-12-08 DIAGNOSIS — I10 Essential (primary) hypertension: Secondary | ICD-10-CM

## 2019-12-10 ENCOUNTER — Encounter: Payer: Self-pay | Admitting: Family Medicine

## 2019-12-10 LAB — CBC
Hematocrit: 45 % (ref 34.0–46.6)
Hemoglobin: 15.5 g/dL (ref 11.1–15.9)
MCH: 30.2 pg (ref 26.6–33.0)
MCHC: 34.4 g/dL (ref 31.5–35.7)
MCV: 88 fL (ref 79–97)
Platelets: 287 10*3/uL (ref 150–450)
RBC: 5.13 x10E6/uL (ref 3.77–5.28)
RDW: 13 % (ref 11.7–15.4)
WBC: 8.7 10*3/uL (ref 3.4–10.8)

## 2019-12-10 LAB — CMP14+EGFR
ALT: 19 IU/L (ref 0–32)
AST: 19 IU/L (ref 0–40)
Albumin/Globulin Ratio: 1.4 (ref 1.2–2.2)
Albumin: 4.7 g/dL (ref 3.8–4.8)
Alkaline Phosphatase: 62 IU/L (ref 48–121)
BUN/Creatinine Ratio: 13 (ref 9–23)
BUN: 14 mg/dL (ref 6–24)
Bilirubin Total: 0.7 mg/dL (ref 0.0–1.2)
CO2: 26 mmol/L (ref 20–29)
Calcium: 9.7 mg/dL (ref 8.7–10.2)
Chloride: 98 mmol/L (ref 96–106)
Creatinine, Ser: 1.05 mg/dL — ABNORMAL HIGH (ref 0.57–1.00)
GFR calc Af Amer: 73 mL/min/{1.73_m2} (ref >59)
GFR calc non Af Amer: 63 mL/min/{1.73_m2} (ref >59)
Globulin, Total: 3.3 g/dL (ref 1.5–4.5)
Glucose: 96 mg/dL (ref 65–99)
Potassium: 3.9 mmol/L (ref 3.5–5.2)
Sodium: 139 mmol/L (ref 134–144)
Total Protein: 8 g/dL (ref 6.0–8.5)

## 2019-12-10 LAB — LIPID PANEL
Chol/HDL Ratio: 3.4 ratio (ref 0.0–4.4)
Cholesterol, Total: 182 mg/dL (ref 100–199)
HDL: 54 mg/dL (ref >39)
LDL Chol Calc (NIH): 111 mg/dL — ABNORMAL HIGH (ref 0–99)
Triglycerides: 96 mg/dL (ref 0–149)
VLDL Cholesterol Cal: 17 mg/dL (ref 5–40)

## 2019-12-10 LAB — TSH: TSH: 1.74 u[IU]/mL (ref 0.450–4.500)

## 2019-12-10 LAB — HEPATITIS C ANTIBODY: Hep C Virus Ab: <0.1 s/co ratio (ref 0.0–0.9)

## 2019-12-10 LAB — VITAMIN D 25 HYDROXY (VIT D DEFICIENCY, FRACTURES): Vit D, 25-Hydroxy: 24.7 ng/mL — ABNORMAL LOW (ref 30.0–100.0)

## 2019-12-10 NOTE — Progress Notes (Signed)
   Quiana Cobaugh     MRN: [id number]      DOB: 1970/05/19   HPI Sherry Wagner is here for follow up and re-evaluation of chronic medical conditions, medication management and review of any available recent lab and radiology data.  Preventive health is updated, specifically  Cancer screening and Immunization.   Questions or concerns regarding consultations or procedures which the PT has had in the interim are  addressed. The PT denies any adverse reactions to current medications since the last visit.  There are no new concerns.  There are no specific complaints   ROS Denies recent fever or chills. Denies sinus pressure, nasal congestion, ear pain or sore throat. Denies chest congestion, productive cough or wheezing. Denies chest pains, palpitations and leg swelling Denies abdominal pain, nausea, vomiting,diarrhea or constipation.   Denies dysuria, frequency, hesitancy or incontinence. Denies joint pain, swelling and limitation in mobility. Denies headaches, seizures, numbness, or tingling. Denies depression, anxiety or insomnia. Denies skin break down or rash.   PE  BP 124/80   Pulse 96   Ht 5\' 5"  (1.651 m)   Wt 232 lb 0.6 oz (105.3 kg)   SpO2 98%   BMI 38.61 kg/m   Patient alert and oriented and in no cardiopulmonary distress.  HEENT: No facial asymmetry, EOMI,     Neck supple .  Chest: Clear to auscultation bilaterally.  CVS: S1, S2 no murmurs, no S3.Regular rate.  ABD: Soft non tender.   Ext: No edema  MS: Adequate ROM spine, shoulders, hips and knees.  Skin: Intact, no ulcerations or rash noted.  Psych: Good eye contact, normal affect. Memory intact not anxious or depressed appearing.  CNS: CN 2-12 intact, power,  normal throughout.no focal deficits noted.   Assessment & Plan  Essential hypertension Controlled, no change in medication DASH diet and commitment to daily physical activity for a minimum of 30 minutes discussed and encouraged, as a part of  hypertension management. The importance of attaining a healthy weight is also discussed.  BP/Weight 12/06/2019 11/22/2019 06/16/2019 11/18/2018 05/05/2018 07/08/7060 06/12/6281  Systolic BP 151 761 607 371 062 694 854  Diastolic BP 80 627 89 94 98 84 86  Wt. (Lbs) 232.04 237.08 - 217 219 216 253  BMI 38.61 39.45 - 36.11 36.44 35.94 42.1       Morbid obesity ([facility])  Patient re-educated about  the importance of commitment to a  minimum of 150 minutes of exercise per week as able.  The importance of healthy food choices with portion control discussed, as well as eating regularly and within a 12 hour window most days. The need to choose "clean , green" food 50 to 75% of the time is discussed, as well as to make water the primary drink and set a goal of 64 ounces water daily.    Weight /BMI 12/06/2019 11/22/2019 11/18/2018  WEIGHT 232 lb 0.6 oz 237 lb 1.3 oz 217 lb  HEIGHT 5\' 5"  5\' 5"  5\' 5"   BMI 38.61 kg/m2 39.45 kg/m2 36.11 kg/m2  improved with lifestyle change    Vitamin D deficiency unchnaged use OTC 2000 IU daily

## 2019-12-10 NOTE — Assessment & Plan Note (Signed)
unchnaged use OTC 2000 IU daily

## 2019-12-10 NOTE — Assessment & Plan Note (Signed)
  Patient re-educated about  the importance of commitment to a  minimum of 150 minutes of exercise per week as able.  The importance of healthy food choices with portion control discussed, as well as eating regularly and within a 12 hour window most days. The need to choose "clean , green" food 50 to 75% of the time is discussed, as well as to make water the primary drink and set a goal of 64 ounces water daily.    Weight /BMI 12/06/2019 11/22/2019 11/18/2018  WEIGHT 232 lb 0.6 oz 237 lb 1.3 oz 217 lb  HEIGHT 5\' 5"  5\' 5"  5\' 5"   BMI 38.61 kg/m2 39.45 kg/m2 36.11 kg/m2  improved with lifestyle change

## 2019-12-10 NOTE — Assessment & Plan Note (Signed)
Controlled, no change in medication DASH diet and commitment to daily physical activity for a minimum of 30 minutes discussed and encouraged, as a part of hypertension management. The importance of attaining a healthy weight is also discussed.  BP/Weight 12/06/2019 11/22/2019 06/16/2019 11/18/2018 05/05/2018 07/15/8649 09/14/6102  Systolic BP 247 319 243 836 542 715 664  Diastolic BP 80 830 89 94 98 84 86  Wt. (Lbs) 232.04 237.08 - 217 219 216 253  BMI 38.61 39.45 - 36.11 36.44 35.94 42.1

## 2020-02-08 ENCOUNTER — Other Ambulatory Visit: Payer: Self-pay

## 2020-02-08 ENCOUNTER — Encounter (HOSPITAL_COMMUNITY): Payer: Self-pay | Admitting: Emergency Medicine

## 2020-02-08 ENCOUNTER — Emergency Department (HOSPITAL_COMMUNITY): Payer: 59

## 2020-02-08 ENCOUNTER — Emergency Department (HOSPITAL_COMMUNITY)
Admission: EM | Admit: 2020-02-08 | Discharge: 2020-02-08 | Disposition: A | Discharge status: Home / Self Care | Payer: 59 | Attending: Emergency Medicine | Admitting: Emergency Medicine

## 2020-02-08 DIAGNOSIS — R12 Heartburn: Secondary | ICD-10-CM

## 2020-02-08 DIAGNOSIS — E876 Hypokalemia: Secondary | ICD-10-CM | POA: Diagnosis not present

## 2020-02-08 DIAGNOSIS — I10 Essential (primary) hypertension: Secondary | ICD-10-CM | POA: Insufficient documentation

## 2020-02-08 DIAGNOSIS — R0789 Other chest pain: Secondary | ICD-10-CM | POA: Diagnosis present

## 2020-02-08 LAB — BASIC METABOLIC PANEL
Anion gap: 11 (ref 5–15)
BUN: 13 mg/dL (ref 6–20)
CO2: 26 mmol/L (ref 22–32)
Calcium: 9 mg/dL (ref 8.9–10.3)
Chloride: 100 mmol/L (ref 98–111)
Creatinine, Ser: 0.98 mg/dL (ref 0.44–1.00)
GFR, Estimated: >60 mL/min (ref >60)
Glucose, Bld: 105 mg/dL — ABNORMAL HIGH (ref 70–99)
Potassium: 3.1 mmol/L — ABNORMAL LOW (ref 3.5–5.1)
Sodium: 137 mmol/L (ref 135–145)

## 2020-02-08 LAB — CBC
HCT: 40.9 % (ref 36.0–46.0)
Hemoglobin: 13.9 g/dL (ref 12.0–15.0)
MCH: 30 pg (ref 26.0–34.0)
MCHC: 34 g/dL (ref 30.0–36.0)
MCV: 88.3 fL (ref 80.0–100.0)
Platelets: 299 10*3/uL (ref 150–400)
RBC: 4.63 MIL/uL (ref 3.87–5.11)
RDW: 13 % (ref 11.5–15.5)
WBC: 11.8 10*3/uL — ABNORMAL HIGH (ref 4.0–10.5)
nRBC: 0 % (ref 0.0–0.2)

## 2020-02-08 LAB — TROPONIN I (HIGH SENSITIVITY)
Troponin I (High Sensitivity): 4 ng/L (ref <18)
Troponin I (High Sensitivity): 4 ng/L (ref <18)

## 2020-02-08 LAB — I-STAT BETA HCG BLOOD, ED (MC, WL, AP ONLY): I-stat hCG, quantitative: <5 m[IU]/mL (ref <5)

## 2020-02-08 MED ORDER — ALUM & MAG HYDROXIDE-SIMETH 200-200-20 MG/5ML PO SUSP
30.0000 mL | Freq: Once | ORAL | Status: AC
  Administered 2020-02-08: 30 mL via ORAL
  Filled 2020-02-08: qty 30

## 2020-02-08 MED ORDER — POTASSIUM CHLORIDE CRYS ER 20 MEQ PO TBCR
40.0000 meq | EXTENDED_RELEASE_TABLET | Freq: Once | ORAL | Status: AC
  Administered 2020-02-08: 40 meq via ORAL
  Filled 2020-02-08: qty 2

## 2020-02-08 MED ORDER — LIDOCAINE VISCOUS HCL 2 % MT SOLN
15.0000 mL | Freq: Once | OROMUCOSAL | Status: AC
  Administered 2020-02-08: 15 mL via ORAL
  Filled 2020-02-08: qty 15

## 2020-02-08 NOTE — ED Triage Notes (Signed)
Pt c/o upper central CP onset 2330 tonight while lying in bed. Intermittent burning non radiating, denies nausea/vomiting/shob/dizziness.

## 2020-02-08 NOTE — ED Provider Notes (Signed)
[facility] EMERGENCY DEPARTMENT Provider Note   CSN: [id number] Arrival date & time: 02/08/20  0018     History Chief Complaint  Patient presents with  . Chest Pain    Sherry Wagner is a 49 y.o. female with PMH of HTN who presents the ED with complaints of chest pain.  On my examination, patient states that her chest pain is unlike anything that she has had previously.  She is described it as a "burning" central chest discomfort.  She looked it up online and was concerned prompting her to come to the ED for evaluation.  Patient reports that her symptoms are still ongoing at the time my examination after she unfortunately was in the waiting room for 9 hours duration.  She states that initially her pain was 6 out of 10 and now has improved to a 3 out of 10.  She states that her symptoms began shortly after crawling into bed last evening.  She endorses having chocolate earlier that evening.  She denies any history of GERD.  She denies any associated shortness of breath, nausea, diaphoresis, or exertional component.  No history of clots or clotting disorder.  No recent surgeries or immobilizations.  Last week she was on vacation and she had one cocktail, but otherwise does not drink alcohol.  No history of abdominal surgeries.  No recent fevers or chills.  HPI     Past Medical History:  Diagnosis Date  . Hypertension   . Obesity     Patient Active Problem List   Diagnosis Date Noted  . Vitamin D deficiency 06/06/2011  . Morbid obesity ([place]) 01/28/2008  . Essential hypertension 01/28/2008    History reviewed. No pertinent surgical history.   OB History   No obstetric history on file.     Family History  Problem Relation Age of Onset  . Cancer Mother 34       renal cell ca   . Thyroid disease Mother 31  . Hypertension Mother 90  . Stroke Mother     Social History   Tobacco Use  . Smoking status: Never Smoker  . Smokeless tobacco: Never Used    Vaping Use  . Vaping Use: Never used  Substance Use Topics  . Alcohol use: No  . Drug use: No    Home Medications Prior to Admission medications   Medication Sig Start Date End Date Taking? Authorizing Provider  cholecalciferol (VITAMIN D3) 25 MCG (1000 UNIT) tablet Take 1,000 Units by mouth daily.   Yes [provider]  ibuprofen (ADVIL) 200 MG tablet Take 400 mg by mouth every 6 (six) hours as needed for headache or cramping.   Yes [provider]  Multiple Vitamins-Minerals (MULTIVITAMIN WITH MINERALS) tablet Take 1 tablet by mouth daily.   Yes [provider]  triamterene-hydrochlorothiazide (MAXZIDE) 75-50 MG tablet Take 1 tablet by mouth daily. 12/06/19  Yes Fayrene Helper, MD  ergocalciferol (VITAMIN D2) 1.25 MG (50000 UT) capsule Take 1 capsule (50,000 Units total) by mouth once a week. One capsule once weekly Patient not taking: Reported on 02/08/2020 12/02/18   Fayrene Helper, MD    Allergies    Patient has no known allergies.  Review of Systems   Review of Systems  All other systems reviewed and are negative.   Physical Exam Updated Vital Signs BP 140/86   Pulse 72   Temp 98.1 F (36.7 C) (Oral)   Resp 14   LMP 02/07/2020   SpO2  99%   Physical Exam Vitals and nursing note reviewed. Exam conducted with a chaperone present.  Constitutional:      General: She is not in acute distress.    Appearance: Normal appearance. She is not ill-appearing.  HENT:     Head: Normocephalic and atraumatic.  Eyes:     General: No scleral icterus.    Conjunctiva/sclera: Conjunctivae normal.  Cardiovascular:     Rate and Rhythm: Normal rate and regular rhythm.     Pulses: Normal pulses.     Heart sounds: Normal heart sounds.  Pulmonary:     Effort: Pulmonary effort is normal. No respiratory distress.     Breath sounds: Normal breath sounds. No wheezing or rales.  Abdominal:     General: Abdomen is flat. There is no distension.      Palpations: Abdomen is soft. There is no mass.     Tenderness: There is no abdominal tenderness. There is no guarding.     Hernia: No hernia is present.     Comments: No abdominal tenderness or distention.  Soft.  Reassessed epigastrium and RUQ, no tenderness whatsoever.  No overlying skin changes.  Musculoskeletal:     Right lower leg: No edema.     Left lower leg: No edema.  Skin:    General: Skin is dry.     Capillary Refill: Capillary refill takes less than 2 seconds.  Neurological:     Mental Status: She is alert and oriented to person, place, and time.     GCS: GCS eye subscore is 4. GCS verbal subscore is 5. GCS motor subscore is 6.  Psychiatric:        Mood and Affect: Mood normal.        Behavior: Behavior normal.        Thought Content: Thought content normal.     ED Results / Procedures / Treatments   Labs (all labs ordered are listed, but only abnormal results are displayed) Labs Reviewed  BASIC METABOLIC PANEL - Abnormal; Notable for the following components:      Result Value   Potassium 3.1 (*)    Glucose, Bld 105 (*)    All other components within normal limits  CBC - Abnormal; Notable for the following components:   WBC 11.8 (*)    All other components within normal limits  I-STAT BETA HCG BLOOD, ED ([facility], [facility], [facility] ONLY)  TROPONIN I (HIGH SENSITIVITY)  TROPONIN I (HIGH SENSITIVITY)    EKG None  Radiology DG Chest 2 View  Result Date: 02/08/2020 CLINICAL DATA:  Chest pain EXAM: CHEST - 2 VIEW COMPARISON:  None. FINDINGS: The heart size and mediastinal contours are within normal limits. Both lungs are clear. The visualized skeletal structures are unremarkable. IMPRESSION: Normal study. Electronically Signed   By: Rolm Baptise M.D.   On: 02/08/2020 01:04    Procedures Procedures (including critical care time)  Medications Ordered in ED Medications  alum & mag hydroxide-simeth (MAALOX/MYLANTA) 200-200-20 MG/5ML suspension 30 mL (30 mLs Oral Given 02/08/20  0951)    And  lidocaine (XYLOCAINE) 2 % viscous mouth solution 15 mL (15 mLs Oral Given 02/08/20 0951)  potassium chloride SA (KLOR-CON) CR tablet 40 mEq (40 mEq Oral Given 02/08/20 0949)    ED Course  I have reviewed the triage vital signs and the nursing notes.  Pertinent labs & imaging results that were available during my care of the patient were reviewed by me and considered in my medical decision making (see  chart for details).    MDM Rules/Calculators/A&P                          Patient's history and physical exam is suggestive of heartburn.  We will treat with GI cocktail here in the ED.  No anemia, melena, or other history concerning for an active PUD.    Labs Troponin: 4 >> 4. BMP: Hypokalemia to 3.1. CBC: Mild leukocytosis to 11.8. I-STAT beta-hCG: Less than 5.  EKG is personally reviewed and demonstrates normal sinus rhythm.  Questionable long QT, but QTC within normal limits.  Personally reviewed plain films obtained of chest which demonstrates no acute cardiopulmonary disease.  On reassessment after providing patient with a GI cocktail including Maalox and lidocaine, patient was feeling entirely improved.  She is accompanied by her husband who is at bedside.  They are both reassured by today's comprehensive work-up.  They agree that this is likely related to GERD, but were concerned given that she had never experienced heartburn previously.  Given that she does not regularly experience symptoms of reflux disease, do not feel as though initiating PPI is warranted.  Instead, recommending Maalox over-the-counter to have as an abortive therapy, as needed.  Patient can follow-up with her primary care provider regarding today's encounter.  She was mildly hypokalemic to 3.1, replenished with 40 mEq K-Dur here in the ED.  She states that her triamterene hydrochlorothiazide was recently increased.  I suspect that may be the contributing factor.  She denied any recent vomiting or  diarrhea.   All of the evaluation and work-up results were discussed with the patient and any family at bedside.  Patient and/or family were informed that while patient is appropriate for discharge at this time, some medical emergencies may only develop or become detectable after a period of time.  I specifically instructed patient and/or family to return to return to the ED or seek immediate medical attention for any new or worsening symptoms.  They were provided opportunity to ask any additional questions and have none at this time.  Prior to discharge patient is feeling well, agreeable with plan for discharge home.  They have expressed understanding of verbal discharge instructions as well as return precautions and are agreeable to the plan.   Final Clinical Impression(s) / ED Diagnoses Final diagnoses:  Heartburn  Hypokalemia    Rx / DC Orders ED Discharge Orders    None       Corena Herter, PA-C 02/08/20 1041    Gareth Morgan, MD 02/08/20 2256

## 2020-02-08 NOTE — Discharge Instructions (Signed)
Your history, physical exam, and work-up here in the ED today was reassuring.  You were noted to be low on potassium, likely due to your increase in your triamterene-HCTZ medication.  We have replenished here in the ED, but please follow-up with your primary care provider for laboratory recheck.  You may need to have your antihypertensive medication adjusted.  Your history and work-up today is suggestive of heartburn.  Given that you do not experience symptoms of reflux regularly, do not feel as though starting you on a prophylactic medication such as proton pump inhibitors is emergently warranted.  However, if you experience increased frequency of heartburn, you may wish to discuss this with your primary care provider.  In the interim, please take Maalox over-the-counter as needed for symptom relief.  Return to the ED or seek immediate medical attention should you experience any new or worsening symptoms.

## 2020-04-11 ENCOUNTER — Ambulatory Visit (INDEPENDENT_AMBULATORY_CARE_PROVIDER_SITE_OTHER): Payer: 59 | Admitting: Family Medicine

## 2020-04-11 ENCOUNTER — Encounter: Payer: Self-pay | Admitting: Family Medicine

## 2020-04-11 ENCOUNTER — Other Ambulatory Visit: Payer: Self-pay

## 2020-04-11 VITALS — BP 132/92 | HR 94 | Resp 15 | Ht 65.0 in | Wt 237.0 lb

## 2020-04-11 DIAGNOSIS — Z0000 Encounter for general adult medical examination without abnormal findings: Secondary | ICD-10-CM | POA: Diagnosis not present

## 2020-04-11 DIAGNOSIS — E559 Vitamin D deficiency, unspecified: Secondary | ICD-10-CM

## 2020-04-11 DIAGNOSIS — I10 Essential (primary) hypertension: Secondary | ICD-10-CM

## 2020-04-11 NOTE — Patient Instructions (Addendum)
F/u in office with MD re evaluate blood pressure in 3 months, call if you need me sooner  Congrats on taking bP meds regularly with improved blood pressure control  Please work on regular exercise, weight loss and low fat diet for improved  Blood pressure and cholesterol   Please follow up on colonoscopy appt for patient, though authorized she has not heard from the office  Please do reach out for counseling for yourself  It is important that you exercise regularly at least 30 minutes 5 times a week. If you develop chest pain, have severe difficulty breathing, or feel very tired, stop exercising immediately and seek medical attention   Think about what you will eat, plan ahead. Choose " clean, green, fresh or frozen" over canned, processed or packaged foods which are more sugary, salty and fatty. 70 to 75% of food eaten should be vegetables and fruit. Three meals at set times with snacks allowed between meals, but they must be fruit or vegetables. Aim to eat over a 12 hour period , example 7 am to 7 pm, and STOP after  your last meal of the day. Drink water,generally about 64 ounces per day, no other drink is as healthy. Fruit juice is best enjoyed in a healthy way, by EATING the fruit.  Fasting lipid, chem 7 and EGFr, and vit D 1 week before next visit  Target weight for next visit is 229 pounds or less   Back Exercises These exercises help to make your trunk and back strong. They also help to keep the lower back flexible. Doing these exercises can help to prevent back pain or lessen existing pain.  If you have back pain, try to do these exercises 2-3 times each day or as told by your doctor.  As you get better, do the exercises once each day. Repeat the exercises more often as told by your doctor.  To stop back pain from coming back, do the exercises once each day, or as told by your doctor. Exercises Single knee to chest Do these steps 3-5 times in a row for each leg: 1. Lie on  your back on a firm bed or the floor with your legs stretched out. 2. Bring one knee to your chest. 3. Grab your knee or thigh with both hands and hold them it in place. 4. Pull on your knee until you feel a gentle stretch in your lower back or buttocks. 5. Keep doing the stretch for 10-30 seconds. 6. Slowly let go of your leg and straighten it. Pelvic tilt Do these steps 5-10 times in a row: 1. Lie on your back on a firm bed or the floor with your legs stretched out. 2. Bend your knees so they point up to the ceiling. Your feet should be flat on the floor. 3. Tighten your lower belly (abdomen) muscles to press your lower back against the floor. This will make your tailbone point up to the ceiling instead of pointing down to your feet or the floor. 4. Stay in this position for 5-10 seconds while you gently tighten your muscles and breathe evenly. Cat-cow Do these steps until your lower back bends more easily: 1. Get on your hands and knees on a firm surface. Keep your hands under your shoulders, and keep your knees under your hips. You may put padding under your knees. 2. Let your head hang down toward your chest. Tighten (contract) the muscles in your belly. Point your tailbone toward the floor so  your lower back becomes rounded like the back of a cat. 3. Stay in this position for 5 seconds. 4. Slowly lift your head. Let the muscles of your belly relax. Point your tailbone up toward the ceiling so your back forms a sagging arch like the back of a cow. 5. Stay in this position for 5 seconds.  Press-ups Do these steps 5-10 times in a row: 1. Lie on your belly (face-down) on the floor. 2. Place your hands near your head, about shoulder-width apart. 3. While you keep your back relaxed and keep your hips on the floor, slowly straighten your arms to raise the top half of your body and lift your shoulders. Do not use your back muscles. You may change where you place your hands in order to make  yourself more comfortable. 4. Stay in this position for 5 seconds. 5. Slowly return to lying flat on the floor.  Bridges Do these steps 10 times in a row: 1. Lie on your back on a firm surface. 2. Bend your knees so they point up to the ceiling. Your feet should be flat on the floor. Your arms should be flat at your sides, next to your body. 3. Tighten your butt muscles and lift your butt off the floor until your waist is almost as high as your knees. If you do not feel the muscles working in your butt and the back of your thighs, slide your feet 1-2 inches farther away from your butt. 4. Stay in this position for 3-5 seconds. 5. Slowly lower your butt to the floor, and let your butt muscles relax. If this exercise is too easy, try doing it with your arms crossed over your chest. Belly crunches Do these steps 5-10 times in a row: 1. Lie on your back on a firm bed or the floor with your legs stretched out. 2. Bend your knees so they point up to the ceiling. Your feet should be flat on the floor. 3. Cross your arms over your chest. 4. Tip your chin a little bit toward your chest but do not bend your neck. 5. Tighten your belly muscles and slowly raise your chest just enough to lift your shoulder blades a tiny bit off of the floor. Avoid raising your body higher than that, because it can put too much stress on your low back. 6. Slowly lower your chest and your head to the floor. Back lifts Do these steps 5-10 times in a row: 1. Lie on your belly (face-down) with your arms at your sides, and rest your forehead on the floor. 2. Tighten the muscles in your legs and your butt. 3. Slowly lift your chest off of the floor while you keep your hips on the floor. Keep the back of your head in line with the curve in your back. Look at the floor while you do this. 4. Stay in this position for 3-5 seconds. 5. Slowly lower your chest and your face to the floor. Contact a doctor if:  Your back pain gets a  lot worse when you do an exercise.  Your back pain does not get better 2 hours after you exercise. If you have any of these problems, stop doing the exercises. Do not do them again unless your doctor says it is okay. Get help right away if:  You have sudden, very bad back pain. If this happens, stop doing the exercises. Do not do them again unless your doctor says it is okay. This  information is not intended to replace advice given to you by your health care provider. Make sure you discuss any questions you have with your health care provider. Document Revised: 12/18/2017 Document Reviewed: 12/18/2017 Elsevier Patient Education  2020 [employer].

## 2020-04-15 ENCOUNTER — Encounter: Payer: Self-pay | Admitting: Family Medicine

## 2020-04-15 NOTE — Assessment & Plan Note (Signed)

## 2020-04-15 NOTE — Assessment & Plan Note (Signed)
  Patient re-educated about  the importance of commitment to a  minimum of 150 minutes of exercise per week as able.  The importance of healthy food choices with portion control discussed, as well as eating regularly and within a 12 hour window most days. The need to choose "clean , green" food 50 to 75% of the time is discussed, as well as to make water the primary drink and set a goal of 64 ounces water daily.    Weight /BMI 04/11/2020 12/06/2019 11/22/2019  WEIGHT 237 lb 232 lb 0.6 oz 237 lb 1.3 oz  HEIGHT 5\' 5"  5\' 5"  5\' 5"   BMI 39.44 kg/m2 38.61 kg/m2 39.45 kg/m2

## 2020-04-15 NOTE — Progress Notes (Signed)
    Sherry Wagner     MRN: [id number]      DOB: 10/08/1970  HPI: Patient is in for annual physical exam. No other health concerns are expressed or addressed at the visit. Recent labs,  are reviewed. Immunization is reviewed , and  Is up to date.   PE: BP (!) 132/92   Pulse 94   Resp 15   Ht 5\' 5"  (1.651 m)   Wt 237 lb (107.5 kg)   SpO2 96%   BMI 39.44 kg/m   Pleasant  female, alert and oriented x 3, in no cardio-pulmonary distress. Afebrile. HEENT No facial trauma or asymetry. Sinuses non tender.  Extra occullar muscles intact.. External ears normal, . Neck: supple, no adenopathy,JVD or thyromegaly.No bruits.  Chest: Clear to ascultation bilaterally.No crackles or wheezes. Non tender to palpation  Breast: No asymetry,no masses or lumps. No tenderness. No nipple discharge or inversion. No axillary or supraclavicular adenopathy  Cardiovascular system; Heart sounds normal,  S1 and  S2 ,no S3.  No murmur, or thrill. Apical beat not displaced Peripheral pulses normal.  Abdomen: Soft, non tender, no organomegaly or masses. No bruits. Bowel sounds normal. No guarding, tenderness or rebound.      Musculoskeletal exam: Full ROM of spine, hips , shoulders and knees. No deformity ,swelling or crepitus noted. No muscle wasting or atrophy.   Neurologic: Cranial nerves 2 to 12 intact. Power, tone ,sensation and reflexes normal throughout. No disturbance in gait. No tremor.  Skin: Intact, no ulceration, erythema , scaling or rash noted. Pigmentation normal throughout  Psych; Normal mood and affect. Judgement and concentration normal   Assessment & Plan:  Annual physical exam Annual exam as documented. Counseling done  re healthy lifestyle involving commitment to 150 minutes exercise per week, heart healthy diet, and attaining healthy weight.The importance of adequate sleep also discussed. Regular seat belt use and home safety, is also discussed. Changes in  health habits are decided on by the patient with goals and time frames  set for achieving them. Immunization and cancer screening needs are specifically addressed at this visit.   Morbid obesity ([place])  Patient re-educated about  the importance of commitment to a  minimum of 150 minutes of exercise per week as able.  The importance of healthy food choices with portion control discussed, as well as eating regularly and within a 12 hour window most days. The need to choose "clean , green" food 50 to 75% of the time is discussed, as well as to make water the primary drink and set a goal of 64 ounces water daily.    Weight /BMI 04/11/2020 12/06/2019 11/22/2019  WEIGHT 237 lb 232 lb 0.6 oz 237 lb 1.3 oz  HEIGHT 5\' 5"  5\' 5"  5\' 5"   BMI 39.44 kg/m2 38.61 kg/m2 39.45 kg/m2

## 2020-04-19 ENCOUNTER — Other Ambulatory Visit: Payer: Self-pay | Admitting: Family Medicine

## 2020-04-19 DIAGNOSIS — Z1211 Encounter for screening for malignant neoplasm of colon: Secondary | ICD-10-CM

## 2020-07-12 ENCOUNTER — Ambulatory Visit: Payer: 59 | Admitting: Family Medicine

## 2020-07-14 ENCOUNTER — Other Ambulatory Visit: Payer: Self-pay

## 2020-07-14 ENCOUNTER — Ambulatory Visit (AMBULATORY_SURGERY_CENTER): Payer: 59 | Admitting: *Deleted

## 2020-07-14 VITALS — Ht 65.5 in | Wt 225.0 lb

## 2020-07-14 DIAGNOSIS — Z1211 Encounter for screening for malignant neoplasm of colon: Secondary | ICD-10-CM

## 2020-07-14 MED ORDER — SUPREP BOWEL PREP KIT 17.5-3.13-1.6 GM/177ML PO SOLN: 1.0000 | Freq: Once | ORAL | 0 refills | Status: AC

## 2020-07-14 NOTE — Progress Notes (Signed)
No egg or soy allergy known to patient  No  past sedation or surgeries or procedures Patient denies ever being told they had issues or difficulty with intubation- never been intubated   No FH of Malignant Hyperthermia No diet pills per patient No home 02 use per patient  No blood thinners per patient  Pt denies issues with constipation  No A fib or A flutter  EMMI video to pt or via MyChart  Pt is fully vaccinated  for Covid   * NO PA's for preps discussed with pt In PV today  Discussed with pt there will be an out-of-pocket cost for prep and that varies from $0 to 70 dollars   Due to the COVID-19 pandemic we are asking patients to follow certain guidelines.  Pt aware of COVID protocols and [facility] guidelines   Pt verified name, DOB, address and insurance during PV today. Pt mailed instruction packet to included paper to complete and mail back to [facility] with addressed and stamped envelope, Emmi video, copy of consent form to read and not return, and instructions PV completed over the phone. Pt encouraged to call with questions or issues. My Chart instructions to pt as well

## 2020-07-20 ENCOUNTER — Other Ambulatory Visit: Payer: Self-pay

## 2020-07-20 ENCOUNTER — Ambulatory Visit (INDEPENDENT_AMBULATORY_CARE_PROVIDER_SITE_OTHER): Payer: 59 | Admitting: Family Medicine

## 2020-07-20 ENCOUNTER — Encounter: Payer: Self-pay | Admitting: Family Medicine

## 2020-07-20 VITALS — BP 128/92 | HR 108 | Temp 98.2°F | Ht 65.0 in | Wt 226.0 lb

## 2020-07-20 DIAGNOSIS — M25512 Pain in left shoulder: Secondary | ICD-10-CM

## 2020-07-20 DIAGNOSIS — I10 Essential (primary) hypertension: Secondary | ICD-10-CM | POA: Diagnosis not present

## 2020-07-20 DIAGNOSIS — G8929 Other chronic pain: Secondary | ICD-10-CM | POA: Insufficient documentation

## 2020-07-20 DIAGNOSIS — M792 Neuralgia and neuritis, unspecified: Secondary | ICD-10-CM | POA: Diagnosis not present

## 2020-07-20 DIAGNOSIS — M25522 Pain in left elbow: Secondary | ICD-10-CM

## 2020-07-20 DIAGNOSIS — Z1231 Encounter for screening mammogram for malignant neoplasm of breast: Secondary | ICD-10-CM

## 2020-07-20 NOTE — Assessment & Plan Note (Signed)
Limitation of movement when left elbow is bent, also spontaneous tremor of left thumb , no inciting factor x 3 months

## 2020-07-20 NOTE — Patient Instructions (Addendum)
F/U in 4. 5 months, call if you need me before  Please schedule mammogram at checkout  Please give 1200 and 1500 cal diet sheet  No med change  You are referred to Orthopedics re left elbow and shoulder pain , also involuntarily twitching of left thumb  It is important that you exercise regularly at least 30 minutes 5 times a week. If you develop chest pain, have severe difficulty breathing, or feel very tired, stop exercising immediately and seek medical attention   Think about what you will eat, plan ahead. Choose " clean, green, fresh or frozen" over canned, processed or packaged foods which are more sugary, salty and fatty. 70 to 75% of food eaten should be vegetables and fruit. Three meals at set times with snacks allowed between meals, but they must be fruit or vegetables. Aim to eat over a 12 hour period , example 7 am to 7 pm, and STOP after  your last meal of the day. Drink water,generally about 64 ounces per day, no other drink is as healthy. Fruit juice is best enjoyed in a healthy way, by EATING the fruit.  Thanks for choosing [facility], we consider it a privelige to serve you.

## 2020-07-20 NOTE — Progress Notes (Signed)
   Sherry Wagner     MRN: [id number]      DOB: 13-Jun-1970   HPI Sherry Wagner is here for follow up and re-evaluation of chronic medical conditions, esp hTN  C/o left forearm and elbow and lower arm pain with tingling in the hand with involuntary twitching of left thumb intermittently x 3 months  ROS Denies recent fever or chills. Denies sinus pressure, nasal congestion, ear pain or sore throat. Denies chest congestion, productive cough or wheezing. Denies chest pains, palpitations and leg swelling Denies abdominal pain, nausea, vomiting,diarrhea or constipation.   Denies dysuria, frequency, hesitancy or incontinence. . Denies headaches, seizures, numbness, or tingling. Denies depression, anxiety or insomnia. Denies skin break down or rash.   PE BP (!) 128/92   Pulse (!) 108   Temp 98.2 F (36.8 C) (Temporal)   Ht 5\' 5"  (1.651 m)   Wt 226 lb (102.5 kg)   LMP 07/14/2020   SpO2 99%   BMI 37.61 kg/m    Patient alert and oriented and in no cardiopulmonary distress.  HEENT: No facial asymmetry, EOMI,     Neck supple .  Chest: Clear to auscultation bilaterally.  CVS: S1, S2 no murmurs, no S3.Regular rate.  ABD: Soft non tender.   Ext: No edema  MS: Adequate ROM spine, shoulders, hips and knees.Left elbow tenderness, tender over left forearm and involuntary left thimbu noted sporadically  Skin: Intact, no ulcerations or rash noted.  Psych: Good eye contact, normal affect. Memory intact not anxious or depressed appearing.  CNS: CN 2-12 intact, power,  normal throughout.no focal deficits noted.   Assessment & Plan  Chronic elbow pain, left Limitation of movement when left elbow is bent, also spontaneous tremor of left thumb , no inciting factor x 3 months  Essential hypertension Not at goal but no med change DASH diet and commitment to daily physical activity for a minimum of 30 minutes discussed and encouraged, as a part of hypertension management. The importance of  attaining a healthy weight is also discussed.  BP/Weight 07/20/2020 07/14/2020 04/11/2020 02/08/2020 12/06/2019 11/22/2019 2/63/7858  Systolic BP 850 - 277 412 878 676 720  Diastolic BP 92 - 92 95 80 110 89  Wt. (Lbs) 226 225 237 - 232.04 237.08 -  BMI 37.61 36.87 39.44 - 38.61 39.45 -       Morbid obesity ([place])  Patient re-educated about  the importance of commitment to a  minimum of 150 minutes of exercise per week as able.  The importance of healthy food choices with portion control discussed, as well as eating regularly and within a 12 hour window most days. The need to choose "clean , green" food 50 to 75% of the time is discussed, as well as to make water the primary drink and set a goal of 64 ounces water daily.    Weight /BMI 07/20/2020 07/14/2020 04/11/2020  WEIGHT 226 lb 225 lb 237 lb  HEIGHT 5\' 5"  5' 5.5" 5\' 5"   BMI 37.61 kg/m2 36.87 kg/m2 39.44 kg/m2

## 2020-07-22 ENCOUNTER — Encounter: Payer: Self-pay | Admitting: Family Medicine

## 2020-07-22 NOTE — Assessment & Plan Note (Signed)
  Patient re-educated about  the importance of commitment to a  minimum of 150 minutes of exercise per week as able.  The importance of healthy food choices with portion control discussed, as well as eating regularly and within a 12 hour window most days. The need to choose "clean , green" food 50 to 75% of the time is discussed, as well as to make water the primary drink and set a goal of 64 ounces water daily.    Weight /BMI 07/20/2020 07/14/2020 04/11/2020  WEIGHT 226 lb 225 lb 237 lb  HEIGHT 5\' 5"  5' 5.5" 5\' 5"   BMI 37.61 kg/m2 36.87 kg/m2 39.44 kg/m2

## 2020-07-22 NOTE — Assessment & Plan Note (Signed)
Not at goal but no med change DASH diet and commitment to daily physical activity for a minimum of 30 minutes discussed and encouraged, as a part of hypertension management. The importance of attaining a healthy weight is also discussed.  BP/Weight 07/20/2020 07/14/2020 04/11/2020 02/08/2020 12/06/2019 11/22/2019 11/16/313  Systolic BP 945 - 859 292 446 286 381  Diastolic BP 92 - 92 95 80 110 89  Wt. (Lbs) 226 225 237 - 232.04 237.08 -  BMI 37.61 36.87 39.44 - 38.61 39.45 -

## 2020-07-25 ENCOUNTER — Encounter: Payer: Self-pay | Admitting: Gastroenterology

## 2020-07-28 ENCOUNTER — Other Ambulatory Visit: Payer: Self-pay

## 2020-07-28 ENCOUNTER — Ambulatory Visit (AMBULATORY_SURGERY_CENTER): Payer: 59 | Admitting: Gastroenterology

## 2020-07-28 ENCOUNTER — Encounter: Payer: Self-pay | Admitting: Gastroenterology

## 2020-07-28 VITALS — BP 131/80 | HR 60 | Temp 97.7°F | Resp 15 | Ht 65.5 in | Wt 225.0 lb

## 2020-07-28 DIAGNOSIS — Z1211 Encounter for screening for malignant neoplasm of colon: Secondary | ICD-10-CM | POA: Diagnosis present

## 2020-07-28 DIAGNOSIS — D125 Benign neoplasm of sigmoid colon: Secondary | ICD-10-CM

## 2020-07-28 DIAGNOSIS — K635 Polyp of colon: Secondary | ICD-10-CM

## 2020-07-28 DIAGNOSIS — D124 Benign neoplasm of descending colon: Secondary | ICD-10-CM

## 2020-07-28 MED ORDER — SODIUM CHLORIDE 0.9 % IV SOLN: 500.0000 mL | Freq: Once | INTRAVENOUS | Status: DC

## 2020-07-28 NOTE — Progress Notes (Signed)
VS by CW  Pt's states no medical or surgical changes since previsit or office visit.  

## 2020-07-28 NOTE — Progress Notes (Signed)
Called to room to assist during endoscopic procedure.  Patient ID and intended procedure confirmed with present staff. Received instructions for my participation in the procedure from the performing physician.  

## 2020-07-28 NOTE — Patient Instructions (Signed)
YOU HAD AN ENDOSCOPIC PROCEDURE TODAY AT THE [facility]:   Refer to the procedure report that was given to you for any specific questions about what was found during the examination.  If the procedure report does not answer your questions, please call your gastroenterologist to clarify.  If you requested that your care partner not be given the details of your procedure findings, then the procedure report has been included in a sealed envelope for you to review at your convenience later.  YOU SHOULD EXPECT: Some feelings of bloating in the abdomen. Passage of more gas than usual.  Walking can help get rid of the air that was put into your GI tract during the procedure and reduce the bloating. If you had a lower endoscopy (such as a colonoscopy or flexible sigmoidoscopy) you may notice spotting of blood in your stool or on the toilet paper. If you underwent a bowel prep for your procedure, you may not have a normal bowel movement for a few days.  Please Note:  You might notice some irritation and congestion in your nose or some drainage.  This is from the oxygen used during your procedure.  There is no need for concern and it should clear up in a day or so.  SYMPTOMS TO REPORT IMMEDIATELY:   Following lower endoscopy (colonoscopy or flexible sigmoidoscopy):  Excessive amounts of blood in the stool  Significant tenderness or worsening of abdominal pains  Swelling of the abdomen that is new, acute  Fever of 100F or higher  For urgent or emergent issues, a gastroenterologist can be reached at any hour by calling [phone]. Do not use MyChart messaging for urgent concerns.    DIET:  We do recommend a small meal at first, but then you may proceed to your regular diet.  Drink plenty of fluids but you should avoid alcoholic beverages for 24 hours.  ACTIVITY:  You should plan to take it easy for the rest of today and you should NOT DRIVE or use heavy machinery until tomorrow (because  of the sedation medicines used during the test).    FOLLOW UP: Our staff will call the number listed on your records 48-72 hours following your procedure to check on you and address any questions or concerns that you may have regarding the information given to you following your procedure. If we do not reach you, we will leave a message.  We will attempt to reach you two times.  During this call, we will ask if you have developed any symptoms of COVID 19. If you develop any symptoms (ie: fever, flu-like symptoms, shortness of breath, cough etc.) before then, please call [phone].  If you test positive for Covid 19 in the 2 weeks post procedure, please call and report this information to us.    If any biopsies were taken you will be contacted by phone or by letter within the next 1-3 weeks.  Please call us at [phone] if you have not heard about the biopsies in 3 weeks.    SIGNATURES/CONFIDENTIALITY: You and/or your care partner have signed paperwork which will be entered into your electronic medical record.  These signatures attest to the fact that that the information above on your After Visit Summary has been reviewed and is understood.  Full responsibility of the confidentiality of this discharge information lies with you and/or your care-partner. 

## 2020-07-28 NOTE — Op Note (Signed)
[facility] Patient Name: Sherry Wagner Procedure Date: 07/28/2020 11:43 AM MRN: [id number] Endoscopist: Ladene Artist , MD Age: 50 Referring MD:  Date of Birth: 26-Apr-1970 Gender: Female Account #: [id number] Procedure:                Colonoscopy Indications:              Screening for colorectal malignant neoplasm Medicines:                Monitored Anesthesia Care Procedure:                Pre-Anesthesia Assessment:                           - Prior to the procedure, a History and Physical                            was performed, and patient medications and                            allergies were reviewed. The patient's tolerance of                            previous anesthesia was also reviewed. The risks                            and benefits of the procedure and the sedation                            options and risks were discussed with the patient.                            All questions were answered, and informed consent                            was obtained. Prior Anticoagulants: The patient has                            taken no previous anticoagulant or antiplatelet                            agents. ASA Grade Assessment: II - A patient with                            mild systemic disease. After reviewing the risks                            and benefits, the patient was deemed in                            satisfactory condition to undergo the procedure.                           After obtaining informed consent, the colonoscope  was passed under direct vision. Throughout the                            procedure, the patient's blood pressure, pulse, and                            oxygen saturations were monitored continuously. The                            Olympus CF-HQ190L [id number]) Colonoscope was                            introduced through the anus and advanced to the the                            cecum,  identified by appendiceal orifice and                            ileocecal valve. The ileocecal valve, appendiceal                            orifice, and rectum were photographed. The quality                            of the bowel preparation was good. The colonoscopy                            was performed without difficulty. The patient                            tolerated the procedure well. Scope In: 11:59:17 AM Scope Out: 12:21:27 PM Scope Withdrawal Time: 0 hours 18 minutes 27 seconds  Total Procedure Duration: 0 hours 22 minutes 10 seconds  Findings:                 The perianal and digital rectal examinations were                            normal.                           Four sessile polyps were found in the sigmoid colon                            (3) and descending colon (1). The polyps were 6 to                            7 mm in size. These polyps were removed with a cold                            snare. Resection and retrieval were complete.                           The exam was otherwise without abnormality on  direct and retroflexion views. Complications:            No immediate complications. Estimated blood loss:                            None. Estimated Blood Loss:     Estimated blood loss: none. Impression:               - Four 6 to 7 mm polyps in the sigmoid colon and in                            the descending colon, removed with a cold snare.                            Resected and retrieved.                           - The examination was otherwise normal on direct                            and retroflexion views. Recommendation:           - Repeat colonoscopy after studies are complete for                            surveillance based on pathology results.                           - Patient has a contact number available for                            emergencies. The signs and symptoms of potential                             delayed complications were discussed with the                            patient. Return to normal activities tomorrow.                            Written discharge instructions were provided to the                            patient.                           - Resume previous diet.                           - Continue present medications.                           - Await pathology results. Ladene Artist, MD 07/28/2020 12:24:25 PM This report has been signed electronically.

## 2020-07-28 NOTE — Progress Notes (Signed)
To PACU, VSS. Report to [email or web address] 

## 2020-08-01 ENCOUNTER — Telehealth: Payer: Self-pay | Admitting: *Deleted

## 2020-08-01 NOTE — Telephone Encounter (Signed)
Follow up call made. 

## 2020-08-01 NOTE — Telephone Encounter (Signed)
  Follow up Call-  Call back number 07/28/2020  Post procedure Call Back phone  # [phone]  Permission to leave phone message Yes  Some recent data might be hidden     Patient questions:  Do you have a fever, pain , or abdominal swelling? No. Pain Score  0 *  Have you tolerated food without any problems? Yes.    Have you been able to return to your normal activities? Yes.    Do you have any questions about your discharge instructions: Diet   No. Medications  No. Follow up visit  No.  Do you have questions or concerns about your Care? No.  Actions: * If pain score is 4 or above: No action needed, pain <4.  1. Have you developed a fever since your procedure? no  2.   Have you had an respiratory symptoms (SOB or cough) since your procedure? no  3.   Have you tested positive for COVID 19 since your procedure no  4.   Have you had any family members/close contacts diagnosed with the COVID 19 since your procedure?  no   If yes to any of these questions please route to Joylene John, RN and Joella Prince, RN

## 2020-08-07 ENCOUNTER — Encounter: Payer: Self-pay | Admitting: Gastroenterology

## 2020-09-08 ENCOUNTER — Ambulatory Visit
Admission: RE | Admit: 2020-09-08 | Discharge: 2020-09-08 | Disposition: A | Discharge status: Home / Self Care | Payer: 59 | Source: Ambulatory Visit | Attending: Family Medicine | Admitting: Family Medicine

## 2020-09-08 ENCOUNTER — Other Ambulatory Visit: Payer: Self-pay

## 2020-11-30 ENCOUNTER — Ambulatory Visit: Payer: 59 | Admitting: Family Medicine

## 2020-12-06 ENCOUNTER — Other Ambulatory Visit: Payer: Self-pay | Admitting: Family Medicine

## 2021-02-02 ENCOUNTER — Other Ambulatory Visit: Payer: Self-pay

## 2021-02-02 ENCOUNTER — Encounter: Payer: Self-pay | Admitting: Family Medicine

## 2021-02-02 ENCOUNTER — Ambulatory Visit (INDEPENDENT_AMBULATORY_CARE_PROVIDER_SITE_OTHER): Payer: 59 | Admitting: Family Medicine

## 2021-02-02 VITALS — BP 138/98 | HR 96 | Resp 18 | Ht 65.0 in | Wt 236.0 lb

## 2021-02-02 DIAGNOSIS — I10 Essential (primary) hypertension: Secondary | ICD-10-CM | POA: Diagnosis not present

## 2021-02-02 DIAGNOSIS — E559 Vitamin D deficiency, unspecified: Secondary | ICD-10-CM

## 2021-02-02 DIAGNOSIS — M545 Low back pain, unspecified: Secondary | ICD-10-CM

## 2021-02-02 DIAGNOSIS — Z6379 Other stressful life events affecting family and household: Secondary | ICD-10-CM

## 2021-02-02 DIAGNOSIS — Z809 Family history of malignant neoplasm, unspecified: Secondary | ICD-10-CM | POA: Diagnosis not present

## 2021-02-02 DIAGNOSIS — G8929 Other chronic pain: Secondary | ICD-10-CM

## 2021-02-02 DIAGNOSIS — Z23 Encounter for immunization: Secondary | ICD-10-CM | POA: Diagnosis not present

## 2021-02-02 DIAGNOSIS — R7302 Impaired glucose tolerance (oral): Secondary | ICD-10-CM

## 2021-02-02 MED ORDER — AMLODIPINE BESYLATE 2.5 MG PO TABS: 2.5000 mg | ORAL_TABLET | Freq: Every day | ORAL | 5 refills | Status: DC

## 2021-02-02 NOTE — Patient Instructions (Addendum)
Annual exam with pap in 8 to 12 weeks, call if you need me sooner  You are referred to [facility] re back pain and spasm x 4 months, since 10/09/2020  Labs today cBC, lipid, cmp and eGFr,  tSH, vit D and HBa1C  I will arrange an Oncology consultation  Flu vaccine today  Need to get your Covid vaccine  .It is important that you exercise regularly at least 30 minutes 5 times a week. If you develop chest pain, have severe difficulty breathing, or feel very tired, stop exercising immediately and seek medical attention    Thanks for choosing [facility], we consider it a privelige to serve you.

## 2021-02-03 LAB — HEMOGLOBIN A1C
Est. average glucose Bld gHb Est-mCnc: 103 mg/dL
Hgb A1c MFr Bld: 5.2 % (ref 4.8–5.6)

## 2021-02-03 LAB — CMP14+EGFR
ALT: 15 IU/L (ref 0–32)
AST: 15 IU/L (ref 0–40)
Albumin/Globulin Ratio: 1.7 (ref 1.2–2.2)
Albumin: 4.7 g/dL (ref 3.8–4.8)
Alkaline Phosphatase: 61 IU/L (ref 44–121)
BUN/Creatinine Ratio: 13 (ref 9–23)
BUN: 13 mg/dL (ref 6–24)
Bilirubin Total: 0.7 mg/dL (ref 0.0–1.2)
CO2: 25 mmol/L (ref 20–29)
Calcium: 9.8 mg/dL (ref 8.7–10.2)
Chloride: 97 mmol/L (ref 96–106)
Creatinine, Ser: 1 mg/dL (ref 0.57–1.00)
Globulin, Total: 2.8 g/dL (ref 1.5–4.5)
Glucose: 107 mg/dL — ABNORMAL HIGH (ref 70–99)
Potassium: 3.7 mmol/L (ref 3.5–5.2)
Sodium: 138 mmol/L (ref 134–144)
Total Protein: 7.5 g/dL (ref 6.0–8.5)
eGFR: 69 mL/min/{1.73_m2} (ref >59)

## 2021-02-03 LAB — VITAMIN D 25 HYDROXY (VIT D DEFICIENCY, FRACTURES): Vit D, 25-Hydroxy: 14.2 ng/mL — ABNORMAL LOW (ref 30.0–100.0)

## 2021-02-03 LAB — CBC
Hematocrit: 43.7 % (ref 34.0–46.6)
Hemoglobin: 14.8 g/dL (ref 11.1–15.9)
MCH: 29.5 pg (ref 26.6–33.0)
MCHC: 33.9 g/dL (ref 31.5–35.7)
MCV: 87 fL (ref 79–97)
Platelets: 340 10*3/uL (ref 150–450)
RBC: 5.02 x10E6/uL (ref 3.77–5.28)
RDW: 12.7 % (ref 11.7–15.4)
WBC: 8.1 10*3/uL (ref 3.4–10.8)

## 2021-02-03 LAB — LIPID PANEL
Chol/HDL Ratio: 3.7 ratio (ref 0.0–4.4)
Cholesterol, Total: 208 mg/dL — ABNORMAL HIGH (ref 100–199)
HDL: 56 mg/dL (ref >39)
LDL Chol Calc (NIH): 135 mg/dL — ABNORMAL HIGH (ref 0–99)
Triglycerides: 96 mg/dL (ref 0–149)
VLDL Cholesterol Cal: 17 mg/dL (ref 5–40)

## 2021-02-03 LAB — TSH: TSH: 2.64 u[IU]/mL (ref 0.450–4.500)

## 2021-02-04 ENCOUNTER — Encounter: Payer: Self-pay | Admitting: Family Medicine

## 2021-02-04 DIAGNOSIS — M549 Dorsalgia, unspecified: Secondary | ICD-10-CM | POA: Insufficient documentation

## 2021-02-04 DIAGNOSIS — Z6379 Other stressful life events affecting family and household: Secondary | ICD-10-CM | POA: Insufficient documentation

## 2021-02-04 DIAGNOSIS — Z809 Family history of malignant neoplasm, unspecified: Secondary | ICD-10-CM | POA: Insufficient documentation

## 2021-02-04 MED ORDER — ERGOCALCIFEROL 1.25 MG (50000 UT) PO CAPS: 50000.0000 [IU] | ORAL_CAPSULE | ORAL | 1 refills | Status: DC

## 2021-02-04 NOTE — Progress Notes (Signed)
Sherry Wagner     MRN: [id number]      DOB: [birth year]   HPI Sherry Wagner is here for follow up and re-evaluation of chronic medical conditions, medication management and review of any available recent lab and radiology data.  Preventive health is updated, specifically  Cancer screening and Immunization.   4 month h/o new low back pain and spasm Ongoing family stressors affecting her health negatively, denies depression, and currently no interest in therapy Concerned about high incidence of cancer in several family members, will refer for Oncology consult ROS Denies recent fever or chills. Denies sinus pressure, nasal congestion, ear pain or sore throat. Denies chest congestion, productive cough or wheezing. Denies chest pains, palpitations and leg swelling Denies abdominal pain, nausea, vomiting,diarrhea or constipation.   Denies dysuria, frequency, hesitancy or incontinence. . Denies headaches, seizures, numbness, or tingling.   PE  BP (!) 138/98   Pulse 96   Resp 18   Ht 5\' 5"  (1.651 m)   Wt 236 lb 0.6 oz (107.1 kg)   SpO2 94%   BMI 39.28 kg/m   Patient alert and oriented and in no cardiopulmonary distress.  HEENT: No facial asymmetry, EOMI,     Neck supple .  Chest: Clear to auscultation bilaterally.  CVS: S1, S2 no murmurs, no S3.Regular rate.  ABD: Soft non tender.   Ext: No edema  MS: Adequate ROM spine, shoulders, hips and knees.  Skin: Intact, no ulcerations or rash noted.  Psych: Good eye contact, normal affect. Memory intact mildly  anxious and at times tearful, not  depressed appearing.  CNS: CN 2-12 intact, power,  normal throughout.no focal deficits noted.   Assessment & Plan Essential hypertension Uncontrolled, add amlodipine 2.5 mg DASH diet and commitment to daily physical activity for a minimum of 30 minutes discussed and encouraged, as a part of hypertension management. The importance of attaining a healthy weight is also  discussed.  BP/Weight 02/02/2021 07/28/2020 07/20/2020 07/14/2020 04/11/2020 02/08/2020 4/40/1027  Systolic BP 253 664 403 - 474 259 563  Diastolic BP 98 80 92 - 92 95 80  Wt. (Lbs) 236.04 225 226 225 237 - 232.04  BMI 39.28 36.87 37.61 36.87 39.44 - 38.61       Morbid obesity ([facility])  Patient re-educated about  the importance of commitment to a  minimum of 150 minutes of exercise per week as able.  The importance of healthy food choices with portion control discussed, as well as eating regularly and within a 12 hour window most days. The need to choose "clean , green" food 50 to 75% of the time is discussed, as well as to make water the primary drink and set a goal of 64 ounces water daily.    Weight /BMI 02/02/2021 07/28/2020 07/20/2020  WEIGHT 236 lb 0.6 oz 225 lb 226 lb  HEIGHT 5\' 5"  5' 5.5" 5\' 5"   BMI 39.28 kg/m2 36.87 kg/m2 37.61 kg/m2      Back pain 4 month h/o low back pain and spasm aggravated by not following her  Regular routine of just sitting at her chair, therfore movement aggravates, refer to Ortho for eval and management  Vitamin D deficiency Uncorrected, needs to commit to once weekly vit D  Family history of cancer Positive f/h of cancer in several family members, mother with kidney cancer , maternal grandmother colon cancer, aunt recently dx with cancer  Stressful life events affecting family and household Pt ventilated for 12 minutes re stresses in her family  negatively affecting her health. No interest in speaking with a Therapist at this time, she has I the past, I do recommend she return, will think about it

## 2021-02-04 NOTE — Assessment & Plan Note (Signed)
4 month h/o low back pain and spasm aggravated by not following her  Regular routine of just sitting at her chair, therfore movement aggravates, refer to Ortho for eval and management

## 2021-02-04 NOTE — Assessment & Plan Note (Signed)
Positive f/h of cancer in several family members, mother with kidney cancer , maternal grandmother colon cancer, aunt recently dx with cancer

## 2021-02-04 NOTE — Assessment & Plan Note (Signed)
Uncorrected, needs to commit to once weekly vit D

## 2021-02-04 NOTE — Assessment & Plan Note (Signed)
Uncontrolled, add amlodipine 2.5 mg DASH diet and commitment to daily physical activity for a minimum of 30 minutes discussed and encouraged, as a part of hypertension management. The importance of attaining a healthy weight is also discussed.  BP/Weight 02/02/2021 07/28/2020 07/20/2020 07/14/2020 04/11/2020 02/08/2020 7/54/4920  Systolic BP 100 712 197 - 588 325 498  Diastolic BP 98 80 92 - 92 95 80  Wt. (Lbs) 236.04 225 226 225 237 - 232.04  BMI 39.28 36.87 37.61 36.87 39.44 - 38.61

## 2021-02-04 NOTE — Assessment & Plan Note (Signed)
Pt ventilated for 12 minutes re stresses in her family negatively affecting her health. No interest in speaking with a Therapist at this time, she has I the past, I do recommend she return, will think about it

## 2021-02-04 NOTE — Assessment & Plan Note (Signed)
  Patient re-educated about  the importance of commitment to a  minimum of 150 minutes of exercise per week as able.  The importance of healthy food choices with portion control discussed, as well as eating regularly and within a 12 hour window most days. The need to choose "clean , green" food 50 to 75% of the time is discussed, as well as to make water the primary drink and set a goal of 64 ounces water daily.    Weight /BMI 02/02/2021 07/28/2020 07/20/2020  WEIGHT 236 lb 0.6 oz 225 lb 226 lb  HEIGHT 5\' 5"  5' 5.5" 5\' 5"   BMI 39.28 kg/m2 36.87 kg/m2 37.61 kg/m2

## 2021-02-06 ENCOUNTER — Telehealth: Payer: Self-pay | Admitting: Genetic Counselor

## 2021-02-06 NOTE — Telephone Encounter (Signed)
Scheduled appt per 10/30 referral. Pt is aware of appt date and time.

## 2021-03-22 ENCOUNTER — Inpatient Hospital Stay: Payer: 59

## 2021-03-22 ENCOUNTER — Other Ambulatory Visit: Payer: Self-pay

## 2021-03-22 ENCOUNTER — Inpatient Hospital Stay: Payer: 59 | Attending: Genetic Counselor | Admitting: Genetic Counselor

## 2021-03-22 DIAGNOSIS — Z8051 Family history of malignant neoplasm of kidney: Secondary | ICD-10-CM

## 2021-03-22 DIAGNOSIS — Z800 Family history of malignant neoplasm of digestive organs: Secondary | ICD-10-CM | POA: Diagnosis not present

## 2021-03-22 NOTE — Progress Notes (Signed)
REFERRING PROVIDER: Fayrene Helper, MD [address] [place],  [place] [id number]  PRIMARY PROVIDER:  Fayrene Helper, MD  PRIMARY REASON FOR VISIT:  Encounter Diagnoses  Name Primary?   Family history of kidney cancer    Family history of colon cancer     HISTORY OF PRESENT ILLNESS:   Sherry Wagner, a 50 y.o. female, was seen for a [place] cancer genetics consultation at the request of Dr. Moshe Cipro due to a family history of cancer.  Sherry Wagner presents to clinic today to discuss the possibility of a hereditary predisposition to cancer, to discuss genetic testing, and to further clarify her future cancer risks, as well as potential cancer risks for family members.   Sherry Wagner is a 50 y.o. female with no personal history of cancer.    CANCER HISTORY:  Oncology History   No history exists.    RISK FACTORS:  First live birth at age 83.  OCP use for approximately 2 years.  Ovaries intact: yes.  Uterus intact: yes.  HRT use: 0 years. Colonoscopy: yes;  4 polyps identified . Mammogram within the last year: yes. Number of breast biopsies: 0. Any excessive radiation exposure in the past: no  Past Medical History:  Diagnosis Date   Family history of anesthetic complications    mom had severe N/V POst op    Hypertension    Obesity     Past Surgical History:  Procedure Laterality Date   NO PAST SURGERIES      Social History   Socioeconomic History   Marital status: Married    Spouse name: Not on file   Number of children: 3    Years of education: Not on file   Highest education level: Not on file  Occupational History   Occupation: employed   Tobacco Use   Smoking status: Never   Smokeless tobacco: Never  Vaping Use   Vaping Use: Never used  Substance and Sexual Activity   Alcohol use: No   Drug use: No   Sexual activity: Not on file  Other Topics Concern   Not on file  Social History Narrative   Not on file   Social Determinants of  Health   Financial Resource Strain: Not on file  Food Insecurity: Not on file  Transportation Needs: Not on file  Physical Activity: Not on file  Stress: Not on file  Social Connections: Not on file     FAMILY HISTORY:  We obtained a detailed, 4-generation family history.  Significant diagnoses are listed below: Family History  Problem Relation Age of Onset   Cancer Mother 108       renal cell ca - passed 85    Thyroid disease Mother 14   Hypertension Mother 22   Stroke Mother    Colon cancer Maternal Grandfather 68       passed 30    Colon polyps Neg Hx    Esophageal cancer Neg Hx    Rectal cancer Neg Hx    Stomach cancer Neg Hx     Sherry Wagner mother was diagnosed with kidney cancer at age 38 and multiple myeloma in her mid/late 78s. She died due to pneumonia at 85. Her maternal aunt was diagnosed with kidney cancer at age 49 and is deceased. Her maternal uncle was diagnosed with kidney cancer and multiple myeloma in his 56s, he died in his 34s. Her maternal grandfather was diagnosed with colon cancer at 67 and died at  66. Her paternal aunt was diagnosed with uterine cancer at 61.   Sherry Wagner is unaware of previous family history of genetic testing for hereditary cancer risks. There is no reported Ashkenazi Jewish ancestry.   GENETIC COUNSELING ASSESSMENT: Sherry Wagner is a 50 y.o. female with a family history of cancer which is somewhat suggestive of a hereditay predisposition to cancer given multiple individuals diagnosed with kidney cancer. We, therefore, discussed and recommended the following at today's visit.   DISCUSSION: We discussed that 5 - 10% of cancer is hereditary and there are several genes that can be associated with hereditary kidney and colon cancer syndromes.  We discussed that testing is beneficial for several reasons, including knowing about other cancer risks, identifying potential screening and risk-reduction options that may be appropriate, and to  understanding if other family members could be at risk for cancer and allowing them to undergo genetic testing.  We reviewed the characteristics, features and inheritance patterns of hereditary cancer syndromes. We also discussed genetic testing, including the appropriate family members to test, the process of testing, insurance coverage and turn-around-time for results. We discussed the implications of a negative, positive, carrier and/or variant of uncertain significant result. We discussed that negative results would be uninformative given that Sherry Wagner does not have a personal history of cancer. We recommended Sherry Wagner pursue genetic testing for a panel that contains genes associated with kidney cancer.  Sherry Wagner was offered a common hereditary cancer panel (47 genes)+ 5 kidney cancer genes and an expanded pan-cancer panel (84 genes). Sherry Wagner was informed of the benefits and limitations of each panel, including that expanded pan-cancer panels contain several genes that do not have clear management guidelines at this point in time.  We also discussed that as the number of genes included on a panel increases, the chances of variants of uncertain significance increases.  After considering the benefits and limitations of each gene panel, Sherry Wagner elected to have [facility]'s Common Cancer Panel+5 kidney cancer genes.   The Common Hereditary Cancers + RNA Panel offered by [facility] includes sequencing, deletion/duplication, and RNA testing of the following genes: APC, ATM, AXIN2, BARD1, BMPR1A, BRCA1, BRCA2, BRIP1, CDH1, CDK4*, CDKN2A (p14ARF)*, CDKN2A (p16INK4a)*, CHEK2, CTNNA1, DICER1, EPCAM (Deletion/duplication testing only), GREM1 (promoter region deletion/duplication testing only), KIT, MEN1, MLH1, MSH2, MSH3, MSH6, MUTYH, NBN, NF1, NHTL1, PALB2, PDGFRA*, PMS2, POLD1, POLE, PTEN, RAD50, RAD51C, RAD51D, SDHB, SDHC, SDHD, SMAD4, SMARCA4. STK11, TP53, TSC1, TSC2, and VHL.  The following genes were  evaluated for sequence changes only: SDHA and HOXB13 c.251G>A variant only.  RNA analysis is not performed for the * genes. The Panel also included BAP1, FH, FLCN, MET, and MITF (52 genes total).   Based on Sherry Wagner's family history of cancer, she may consider genetic testing because she has 3 first/second degree relatives with a history of kidney cancer, all of which have passed. We discussed that if she has an out of pocket cost, the laboratory will call and confirm whether she wants to proceed with testing. She will also have a $250 self pay option.   A federal law called the Genetic Information Non-Discrimination Act (GINA) of 2008 helps protect individuals against genetic discrimination based on their genetic test results.  It impacts both health insurance and employment.  With health insurance, it protects against increased premiums, being kicked off insurance or being forced to take a test in order to be insured.  For employment it protects against hiring, firing and promoting decisions based  on genetic test results.  GINA does not apply to those in the [employer], those who work for companies with less than 15 employees, and new life insurance or long-term disability insurance policies.  Health status due to a cancer diagnosis is not protected under GINA.  PLAN: After considering the risks, benefits, and limitations, Sherry Wagner provided informed consent to pursue genetic testing and the blood sample was sent to [facility] for analysis of the Common Cancer Panel+ 5 genes associated with kidney cancer. Results should be available within approximately 2-3 weeks' time, at which point they will be disclosed by telephone to Sherry Wagner, as will any additional recommendations warranted by these results. Sherry Wagner will receive a summary of her genetic counseling visit and a copy of her results once available. This information will also be available in Epic.   Sherry Wagner questions were answered to  her satisfaction today. Our contact information was provided should additional questions or concerns arise. Thank you for the referral and allowing [place] to share in the care of your patient.   Lucille Passy, MS, [facility] Genetic Counselor [email or web address] (P) [phone]  The patient was seen for a total of 40 minutes in face-to-face genetic counseling. The patient was seen alone.  Drs. Magrinat, Lindi Adie and/or Burr Medico were available to discuss this case as needed.  _______________________________________________________________________ For Office Staff:  Number of people involved in session: 1 Was an Intern/ student involved with case: no

## 2021-03-23 ENCOUNTER — Encounter: Payer: Self-pay | Admitting: Genetic Counselor

## 2021-03-23 DIAGNOSIS — Z8051 Family history of malignant neoplasm of kidney: Secondary | ICD-10-CM | POA: Insufficient documentation

## 2021-03-23 DIAGNOSIS — Z800 Family history of malignant neoplasm of digestive organs: Secondary | ICD-10-CM | POA: Insufficient documentation

## 2021-03-23 LAB — GENETIC SCREENING ORDER

## 2021-04-11 ENCOUNTER — Telehealth: Payer: Self-pay | Admitting: Genetic Counselor

## 2021-04-11 ENCOUNTER — Encounter: Payer: Self-pay | Admitting: Genetic Counselor

## 2021-04-11 DIAGNOSIS — Z1379 Encounter for other screening for genetic and chromosomal anomalies: Secondary | ICD-10-CM | POA: Insufficient documentation

## 2021-04-11 NOTE — Telephone Encounter (Addendum)
I contacted Ms. Vazques to discuss her genetic testing results. No pathogenic variants were identified in the 52 genes analyzed. Of note, a variant of uncertain significance was identified in the APC, BRCA2, and PDGFRA genes. Detailed clinic note to follow.  The test report has been scanned into EPIC and is located under the Molecular Pathology section of the Results Review tab.  A portion of the result report is included below for reference.   Lucille Passy, MS, [facility] Genetic Counselor [email or web address] (P) [phone]

## 2021-04-23 ENCOUNTER — Ambulatory Visit: Payer: Self-pay | Admitting: Genetic Counselor

## 2021-04-23 DIAGNOSIS — Z1379 Encounter for other screening for genetic and chromosomal anomalies: Secondary | ICD-10-CM

## 2021-04-23 NOTE — Progress Notes (Signed)
HPI:   Sherry Wagner was previously seen in the [place] clinic due to a family history of cancer and concerns regarding a hereditary predisposition to cancer. Please refer to our prior cancer genetics clinic note for more information regarding our discussion, assessment and recommendations, at the time. Sherry Wagner recent genetic test results were disclosed to her, as were recommendations warranted by these results. These results and recommendations are discussed in more detail below.  CANCER HISTORY:  Oncology History   No history exists.    FAMILY HISTORY:  We obtained a detailed, 4-generation family history.  Significant diagnoses are listed below:      Family History  Problem Relation Age of Onset   Cancer Mother 109        renal cell ca - passed 44    Thyroid disease Mother 68   Hypertension Mother 98   Stroke Mother     Colon cancer Maternal Grandfather 68        passed 49    Colon polyps Neg Hx     Esophageal cancer Neg Hx     Rectal cancer Neg Hx     Stomach cancer Neg Hx        Sherry Wagner's mother was diagnosed with kidney cancer at age 37 and multiple myeloma in her mid/late 98s. She died due to pneumonia at 9. Her maternal aunt was diagnosed with kidney cancer at age 58 and is deceased. Her maternal uncle was diagnosed with kidney cancer and multiple myeloma in his 28s, he died in his 87s. Her maternal grandfather was diagnosed with colon cancer at 34 and died at 77. Her paternal aunt was diagnosed with uterine cancer at 60.    Sherry Wagner is unaware of previous family history of genetic testing for hereditary cancer risks. There is no reported Ashkenazi Jewish ancestry.   GENETIC TEST RESULTS:  The [facility] Common Cancer Panel+ BAP1, FH, FLCN, MET, and MITF (52 genes total) found no pathogenic mutations.   The Common Hereditary Cancers + RNA Panel offered by [facility] includes sequencing, deletion/duplication, and RNA testing of the following 47 genes: APC,  ATM, AXIN2, BARD1, BMPR1A, BRCA1, BRCA2, BRIP1, CDH1, CDK4*, CDKN2A (p14ARF)*, CDKN2A (p16INK4a)*, CHEK2, CTNNA1, DICER1, EPCAM (Deletion/duplication testing only), GREM1 (promoter region deletion/duplication testing only), KIT, MEN1, MLH1, MSH2, MSH3, MSH6, MUTYH, NBN, NF1, NHTL1, PALB2, PDGFRA*, PMS2, POLD1, POLE, PTEN, RAD50, RAD51C, RAD51D, SDHB, SDHC, SDHD, SMAD4, SMARCA4. STK11, TP53, TSC1, TSC2, and VHL.  The following genes were evaluated for sequence changes only: SDHA and HOXB13 c.251G>A variant only.  RNA analysis is not performed for the * genes.     The test report has been scanned into EPIC and is located under the Molecular Pathology section of the Results Review tab.  A portion of the result report is included below for reference. Genetic testing reported out on 04/03/2021.      Genetic testing identified a variant of uncertain significance (VUS) in the APC gene (c.647G>A), BRCA2 gene (c.4105T>C), and PDGFRA gene (c.1424A>C).  At this time, it is unknown if the variants are associated with an increased risk for cancer or if they are benign, but most uncertain variants are reclassified to benign. It should not be used to make medical management decisions. With time, we suspect the laboratory will determine the significance of the variants, if any. If the laboratory reclassifies the variants, we will attempt to contact Sherry Wagner to discuss it further.   Even though a pathogenic variant was not  identified, possible explanations for the cancer in the family may include: There may be no hereditary risk for cancer in the family. The cancers in Sherry Wagner's family may be due to other genetic or environmental factors. There may be a gene mutation in one of these genes that current testing methods cannot detect, but that chance is small. There could be another gene that has not yet been discovered, or that we have not yet tested, that is responsible for the cancer diagnoses in the family.  It is  also possible there is a hereditary cause for the cancer in the family that Sherry Wagner did not inherit.  Therefore, it is important to remain in touch with cancer genetics in the future so that we can continue to offer Sherry Wagner the most up to date genetic testing.   ADDITIONAL GENETIC TESTING:  We discussed with Sherry Wagner that her genetic testing was fairly extensive.  If there are genes identified to increase cancer risk that can be analyzed in the future, we would be happy to discuss and coordinate this testing at that time.    CANCER SCREENING RECOMMENDATIONS:  Sherry Wagner test result is considered negative (normal).  This means that we have not identified a hereditary cause for her family history of cancer at this time.  An individual's cancer risk and medical management are not determined by genetic test results alone. Overall cancer risk assessment incorporates additional factors, including personal medical history, family history, and any available genetic information that may result in a personalized plan for cancer prevention and surveillance. Therefore, it is recommended she continue to follow the cancer management and screening guidelines provided by her primary healthcare provider.  Based on the reported personal and family history, specific cancer screenings for Sherry Wagner and her family include:  Kidney Cancer Screening:  Physicians may recommend imaging tests such as renal ultrasounds for individuals with a family history of kidney cancer. Of note, imaging of the kidneys may have unexpected findings and many times they are benign. We suggest she follow the kidney cancer screening recommended by her physicians.   RECOMMENDATIONS FOR FAMILY MEMBERS:   Since she did not inherit a mutation in a cancer predisposition gene included on this panel, her children could not have inherited a mutation from her in one of these genes. Other members of the family may still carry a  pathogenic variant in one of these genes that Sherry Wagner did not inherit. Based on the family history, we recommend her full sister have genetic counseling and testing.  We do not recommend familial testing for the APC, BRCA2, and PDGFRA variants of uncertain significance (VUS).  FOLLOW-UP:  Cancer genetics is a rapidly advancing field and it is possible that new genetic tests will be appropriate for her and/or her family members in the future. We encouraged her to remain in contact with cancer genetics on an annual basis so we can update her personal and family histories and let her know of advances in cancer genetics that may benefit this family.   Our contact number was provided. Sherry Wagner questions were answered to her satisfaction, and she knows she is welcome to call us at anytime with additional questions or concerns.   Lucille Passy, MS, [facility] Genetic Counselor [email or web address] (P) [phone]

## 2021-05-02 ENCOUNTER — Other Ambulatory Visit: Payer: Self-pay

## 2021-05-02 ENCOUNTER — Encounter: Payer: Self-pay | Admitting: Family Medicine

## 2021-05-02 ENCOUNTER — Ambulatory Visit (INDEPENDENT_AMBULATORY_CARE_PROVIDER_SITE_OTHER): Payer: 59 | Admitting: Family Medicine

## 2021-05-02 ENCOUNTER — Other Ambulatory Visit (HOSPITAL_COMMUNITY)
Admission: RE | Admit: 2021-05-02 | Discharge: 2021-05-02 | Disposition: A | Discharge status: Home / Self Care | Payer: 59 | Source: Ambulatory Visit | Attending: Family Medicine | Admitting: Family Medicine

## 2021-05-02 VITALS — BP 120/92 | HR 88 | Ht 65.0 in | Wt 231.1 lb

## 2021-05-02 DIAGNOSIS — N760 Acute vaginitis: Secondary | ICD-10-CM | POA: Diagnosis present

## 2021-05-02 DIAGNOSIS — Z0001 Encounter for general adult medical examination with abnormal findings: Secondary | ICD-10-CM

## 2021-05-02 DIAGNOSIS — R109 Unspecified abdominal pain: Secondary | ICD-10-CM

## 2021-05-02 DIAGNOSIS — Z124 Encounter for screening for malignant neoplasm of cervix: Secondary | ICD-10-CM | POA: Insufficient documentation

## 2021-05-02 DIAGNOSIS — Z8051 Family history of malignant neoplasm of kidney: Secondary | ICD-10-CM | POA: Diagnosis not present

## 2021-05-02 DIAGNOSIS — R1013 Epigastric pain: Secondary | ICD-10-CM | POA: Diagnosis not present

## 2021-05-02 DIAGNOSIS — I10 Essential (primary) hypertension: Secondary | ICD-10-CM | POA: Diagnosis not present

## 2021-05-02 DIAGNOSIS — Z0000 Encounter for general adult medical examination without abnormal findings: Secondary | ICD-10-CM

## 2021-05-02 DIAGNOSIS — Z1231 Encounter for screening mammogram for malignant neoplasm of breast: Secondary | ICD-10-CM | POA: Diagnosis not present

## 2021-05-02 NOTE — Assessment & Plan Note (Signed)

## 2021-05-02 NOTE — Progress Notes (Signed)
Sherry Wagner     MRN: [id number]      DOB: 1970-11-10  HPI: Patient is in for annual physical exam.c/o flank pain C/o epigastric fullness and discomfort x 1 week, like a bubble. C/o flank pain intermittently, and has f/h of kidney cancer , concerned as to cause of pain . Immunization is reviewed , and is to be  updated    PE: BP (!) 120/92    Pulse 88    Ht 5\' 5"  (1.651 m)    Wt 231 lb 1.9 oz (104.8 kg)    LMP 03/30/2021 (Approximate)    SpO2 98%    BMI 38.46 kg/m   Pleasant  female, alert and oriented x 3, in no cardio-pulmonary distress. Afebrile. HEENT No facial trauma or asymetry. Sinuses non tender.  Extra occullar muscles intact.. External ears normal, . Neck: supple, no adenopathy,JVD or thyromegaly.No bruits.  Chest: Clear to ascultation bilaterally.No crackles or wheezes. Non tender to palpation  Breast: No asymetry,no masses or lumps. No tenderness. No nipple discharge or inversion. No axillary or supraclavicular adenopathy  Cardiovascular system; Heart sounds normal,  S1 and  S2 ,no S3.  No murmur, or thrill. Apical beat not displaced Peripheral pulses normal.  Abdomen: Soft, tender over flanke, no organomegaly or masses. No bruits. Bowel sounds normal. No guarding, tenderness or rebound.   GU: External genitalia normal female genitalia , normal female distribution of hair. No lesions. Urethral meatus normal in size, no  Prolapse, no lesions visibly  Present. Bladder non tender. Vagina pink and moist , with no visible lesions , discharge present . Adequate pelvic support no  cystocele or rectocele noted Cervix pink and appears healthy, no lesions or ulcerations noted, no discharge noted from os Uterus normal size, no adnexal masses, no cervical motion or adnexal tenderness.   Musculoskeletal exam: Full ROM of spine, hips , shoulders and knees. No deformity ,swelling or crepitus noted. No muscle wasting or atrophy.   Neurologic: Cranial nerves  2 to 12 intact. Power, tone ,sensation and reflexes normal throughout. No disturbance in gait. No tremor.  Skin: Intact, no ulceration, erythema , scaling or rash noted. Pigmentation normal throughout  Psych; Normal mood and affect. Judgement and concentration normal   Assessment & Plan:  Annual physical exam Annual exam as documented. Counseling done  re healthy lifestyle involving commitment to 150 minutes exercise per week, heart healthy diet, and attaining healthy weight.The importance of adequate sleep also discussed. Regular seat belt use and home safety, is also discussed. Changes in health habits are decided on by the patient with goals and time frames  set for achieving them. Immunization and cancer screening needs are specifically addressed at this visit.   Dyspepsia 1 week h/o discomfort and bloating, needs H pylori and [place], if both negatives , then gI  Flank pain Flank pain with HTN and f/h of renal ca, refer for [place], renal  Morbid obesity ([place])  Patient re-educated about  the importance of commitment to a  minimum of 150 minutes of exercise per week as able.  The importance of healthy food choices with portion control discussed, as well as eating regularly and within a 12 hour window most days. The need to choose "clean , green" food 50 to 75% of the time is discussed, as well as to make water the primary drink and set a goal of 64 ounces water daily.    Weight /BMI 05/02/2021 02/02/2021 07/28/2020  WEIGHT 231 lb 1.9 oz 236 lb  0.6 oz 225 lb  HEIGHT 5\' 5"  5\' 5"  5' 5.5"  BMI 38.46 kg/m2 39.28 kg/m2 36.87 kg/m2

## 2021-05-02 NOTE — Patient Instructions (Addendum)
F/U in 5 months, call if you need me before  Congrats on weight loss.Keep it up!  BP is MUCH improved   Please schedule June mammogram at checkout  H pylori breath test today due to dyspesia    Fasting labs 5 days before next appointment, CBC, lipid, cmp and EGFr, tSH, vit D  [place] of liver will be scheduled re dyspepsia  Renal US will be scheduled re pain and HTN  Weight loss goal of 2 pounds per month  It is important that you exercise regularly at least 30 minutes 5 times a week. If you develop chest pain, have severe difficulty breathing, or feel very tired, stop exercising immediately and seek medical attention   Thanks for choosing [facility], we consider it a privelige to serve you.

## 2021-05-02 NOTE — Assessment & Plan Note (Signed)
1 week h/o discomfort and bloating, needs H pylori and [place], if both negatives , then gI

## 2021-05-03 LAB — CYTOLOGY - PAP
Comment: NEGATIVE
Diagnosis: NEGATIVE
High risk HPV: NEGATIVE

## 2021-05-03 LAB — CERVICOVAGINAL ANCILLARY ONLY
Bacterial Vaginitis (gardnerella): NEGATIVE
Candida Glabrata: NEGATIVE
Candida Vaginitis: NEGATIVE
Chlamydia: NEGATIVE
Comment: NEGATIVE
Comment: NEGATIVE
Comment: NEGATIVE
Comment: NEGATIVE
Comment: NEGATIVE
Comment: NORMAL
Neisseria Gonorrhea: NEGATIVE
Trichomonas: NEGATIVE

## 2021-05-04 LAB — H. PYLORI BREATH TEST: H pylori Breath Test: NEGATIVE

## 2021-05-05 NOTE — Assessment & Plan Note (Signed)
°  Patient re-educated about  the importance of commitment to a  minimum of 150 minutes of exercise per week as able.  The importance of healthy food choices with portion control discussed, as well as eating regularly and within a 12 hour window most days. The need to choose "clean , green" food 50 to 75% of the time is discussed, as well as to make water the primary drink and set a goal of 64 ounces water daily.    Weight /BMI 05/02/2021 02/02/2021 07/28/2020  WEIGHT 231 lb 1.9 oz 236 lb 0.6 oz 225 lb  HEIGHT 5\' 5"  5\' 5"  5' 5.5"  BMI 38.46 kg/m2 39.28 kg/m2 36.87 kg/m2

## 2021-05-05 NOTE — Assessment & Plan Note (Signed)
Flank pain with HTN and f/h of renal ca, refer for [place], renal

## 2021-05-11 ENCOUNTER — Other Ambulatory Visit: Payer: Self-pay

## 2021-05-11 ENCOUNTER — Other Ambulatory Visit: Payer: Self-pay | Admitting: Family Medicine

## 2021-05-11 ENCOUNTER — Ambulatory Visit (HOSPITAL_COMMUNITY)
Admission: RE | Admit: 2021-05-11 | Discharge: 2021-05-11 | Disposition: A | Discharge status: Home / Self Care | Payer: 59 | Source: Ambulatory Visit | Attending: Family Medicine | Admitting: Family Medicine

## 2021-05-11 ENCOUNTER — Encounter (HOSPITAL_COMMUNITY): Payer: Self-pay

## 2021-05-11 DIAGNOSIS — I10 Essential (primary) hypertension: Secondary | ICD-10-CM | POA: Diagnosis present

## 2021-05-11 DIAGNOSIS — Z8051 Family history of malignant neoplasm of kidney: Secondary | ICD-10-CM

## 2021-05-11 DIAGNOSIS — R1013 Epigastric pain: Secondary | ICD-10-CM | POA: Diagnosis not present

## 2021-05-11 DIAGNOSIS — R109 Unspecified abdominal pain: Secondary | ICD-10-CM | POA: Diagnosis present

## 2021-08-21 ENCOUNTER — Other Ambulatory Visit: Payer: Self-pay | Admitting: Family Medicine

## 2021-10-03 ENCOUNTER — Ambulatory Visit: Payer: 59 | Admitting: Family Medicine

## 2021-11-06 ENCOUNTER — Ambulatory Visit
Admission: RE | Admit: 2021-11-06 | Discharge: 2021-11-06 | Disposition: A | Discharge status: Home / Self Care | Payer: 59 | Source: Ambulatory Visit | Attending: Family Medicine | Admitting: Family Medicine

## 2021-11-29 ENCOUNTER — Ambulatory Visit (INDEPENDENT_AMBULATORY_CARE_PROVIDER_SITE_OTHER): Payer: 59 | Admitting: Family Medicine

## 2021-11-29 ENCOUNTER — Encounter: Payer: Self-pay | Admitting: Family Medicine

## 2021-11-29 VITALS — BP 130/90 | HR 93 | Resp 16 | Ht 65.0 in | Wt 244.0 lb

## 2021-11-29 DIAGNOSIS — R7989 Other specified abnormal findings of blood chemistry: Secondary | ICD-10-CM

## 2021-11-29 DIAGNOSIS — E559 Vitamin D deficiency, unspecified: Secondary | ICD-10-CM

## 2021-11-29 DIAGNOSIS — Z13220 Encounter for screening for lipoid disorders: Secondary | ICD-10-CM

## 2021-11-29 DIAGNOSIS — I10 Essential (primary) hypertension: Secondary | ICD-10-CM | POA: Diagnosis not present

## 2021-11-29 DIAGNOSIS — E785 Hyperlipidemia, unspecified: Secondary | ICD-10-CM

## 2021-11-29 DIAGNOSIS — Z23 Encounter for immunization: Secondary | ICD-10-CM | POA: Diagnosis not present

## 2021-11-29 MED ORDER — AMLODIPINE BESYLATE 5 MG PO TABS: 5.0000 mg | ORAL_TABLET | Freq: Every day | ORAL | 1 refills | Status: DC

## 2021-11-29 NOTE — Patient Instructions (Addendum)
F/U in early DEc, call if you need me before  Shingrix #1 today  Labs from Jan today  Higher dose amlodipine 5 mg one daily  start today   Thanks for choosing [facility], we consider it a privelige to serve you.

## 2021-11-29 NOTE — Progress Notes (Unsigned)
Sherry Wagner     MRN: [id number]      DOB: 1970/11/19   HPI [name] is here for follow up and re-evaluation of chronic medical conditions, medication management and review of any available recent lab and radiology data.  Preventive health is updated, specifically  Cancer screening and Immunization.   Questions or concerns regarding consultations or procedures which the PT has had in the interim are  addressed. The PT denies any adverse reactions to current medications since the last visit.  There are no new concerns.  There are no specific complaints   ROS Denies recent fever or chills. Denies sinus pressure, nasal congestion, ear pain or sore throat. Denies chest congestion, productive cough or wheezing. Denies chest pains, palpitations and leg swelling Denies abdominal pain, nausea, vomiting,diarrhea or constipation.   Denies dysuria, frequency, hesitancy or incontinence. Denies joint pain, swelling and limitation in mobility. Denies headaches, seizures, numbness, or tingling. Denies depression, anxiety or insomnia. Denies skin break down or rash.   PE  BP (!) 130/90   Pulse 93   Resp 16   Ht '5\' 5"'$  (1.651 m)   Wt 244 lb (110.7 kg)   SpO2 95%   BMI 40.60 kg/m   Patient alert and oriented and in no cardiopulmonary distress.  HEENT: No facial asymmetry, EOMI,     Neck supple .  Chest: Clear to auscultation bilaterally.  CVS: S1, S2 no murmurs, no S3.Regular rate.  ABD: Soft non tender.   Ext: No edema  MS: Adequate ROM spine, shoulders, hips and knees.  Skin: Intact, no ulcerations or rash noted.  Psych: Good eye contact, normal affect. Memory intact not anxious or depressed appearing.  CNS: CN 2-12 intact, power,  normal throughout.no focal deficits noted.   Assessment & Plan  Essential hypertension Uncontrolled , inc amlodipine dose DASH diet and commitment to daily physical activity for a minimum of 30 minutes discussed and encouraged, as a part of  hypertension management. The importance of attaining a healthy weight is also discussed.     11/29/2021    4:19 PM 11/29/2021    4:18 PM 11/29/2021    4:05 PM 05/02/2021    8:57 AM 05/02/2021    8:38 AM 02/02/2021    9:32 AM 02/02/2021    9:06 AM  BP/Weight  Systolic BP 834 196 222 979 892 119 417  Diastolic BP 90 90 95 92 91 98 102  Wt. (Lbs)   244  231.12  236.04  BMI   40.6 kg/m2  38.46 kg/m2  39.28 kg/m2       Morbid obesity (HCC) deteriorrated   Patient re-educated about  the importance of commitment to a  minimum of 150 minutes of exercise per week as able.  The importance of healthy food choices with portion control discussed, as well as eating regularly and within a 12 hour window most days. The need to choose "clean , green" food 50 to 75% of the time is discussed, as well as to make water the primary drink and set a goal of 64 ounces water daily.       11/29/2021    4:05 PM 05/02/2021    8:38 AM 02/02/2021    9:06 AM  Weight /BMI  Weight 244 lb 231 lb 1.9 oz 236 lb 0.6 oz  Height '5\' 5"'$  (1.651 m) '5\' 5"'$  (1.651 m) '5\' 5"'$  (1.651 m)  BMI 40.6 kg/m2 38.46 kg/m2 39.28 kg/m2      Vitamin D deficiency Needs  to commit to weekly vit D  Hyperlipidemia LDL goal <100 Hyperlipidemia:Low fat diet discussed and encouraged.   Lipid Panel  Lab Results  Component Value Date   CHOL 219 (H) 11/29/2021   HDL 56 11/29/2021   LDLCALC 145 (H) 11/29/2021   TRIG 104 11/29/2021   CHOLHDL 3.9 11/29/2021     Needs to lower fay intake  Low serum vitamin D NEEDS WEEKLY SUPPLEMENT FOR AT LEAST 6 MONTHS

## 2021-11-30 ENCOUNTER — Encounter: Payer: Self-pay | Admitting: Family Medicine

## 2021-11-30 DIAGNOSIS — E785 Hyperlipidemia, unspecified: Secondary | ICD-10-CM | POA: Insufficient documentation

## 2021-11-30 LAB — CMP14+EGFR
ALT: 17 IU/L (ref 0–32)
AST: 20 IU/L (ref 0–40)
Albumin/Globulin Ratio: 1.4 (ref 1.2–2.2)
Albumin: 4.5 g/dL (ref 3.9–4.9)
Alkaline Phosphatase: 61 IU/L (ref 44–121)
BUN/Creatinine Ratio: 9 (ref 9–23)
BUN: 10 mg/dL (ref 6–24)
Bilirubin Total: 0.5 mg/dL (ref 0.0–1.2)
CO2: 24 mmol/L (ref 20–29)
Calcium: 9.8 mg/dL (ref 8.7–10.2)
Chloride: 99 mmol/L (ref 96–106)
Creatinine, Ser: 1.1 mg/dL — ABNORMAL HIGH (ref 0.57–1.00)
Globulin, Total: 3.2 g/dL (ref 1.5–4.5)
Glucose: 90 mg/dL (ref 70–99)
Potassium: 3.6 mmol/L (ref 3.5–5.2)
Sodium: 140 mmol/L (ref 134–144)
Total Protein: 7.7 g/dL (ref 6.0–8.5)
eGFR: 61 mL/min/{1.73_m2} (ref >59)

## 2021-11-30 LAB — LIPID PANEL
Chol/HDL Ratio: 3.9 ratio (ref 0.0–4.4)
Cholesterol, Total: 219 mg/dL — ABNORMAL HIGH (ref 100–199)
HDL: 56 mg/dL (ref >39)
LDL Chol Calc (NIH): 145 mg/dL — ABNORMAL HIGH (ref 0–99)
Triglycerides: 104 mg/dL (ref 0–149)
VLDL Cholesterol Cal: 18 mg/dL (ref 5–40)

## 2021-11-30 LAB — CBC
Hematocrit: 40.8 % (ref 34.0–46.6)
Hemoglobin: 14.1 g/dL (ref 11.1–15.9)
MCH: 30.1 pg (ref 26.6–33.0)
MCHC: 34.6 g/dL (ref 31.5–35.7)
MCV: 87 fL (ref 79–97)
Platelets: 333 10*3/uL (ref 150–450)
RBC: 4.68 x10E6/uL (ref 3.77–5.28)
RDW: 13.5 % (ref 11.7–15.4)
WBC: 10.8 10*3/uL (ref 3.4–10.8)

## 2021-11-30 LAB — VITAMIN D 25 HYDROXY (VIT D DEFICIENCY, FRACTURES): Vit D, 25-Hydroxy: 16.8 ng/mL — ABNORMAL LOW (ref 30.0–100.0)

## 2021-11-30 LAB — TSH: TSH: 2.19 u[IU]/mL (ref 0.450–4.500)

## 2021-11-30 MED ORDER — ERGOCALCIFEROL 1.25 MG (50000 UT) PO CAPS
50000.0000 [IU] | ORAL_CAPSULE | ORAL | 1 refills | Status: DC
Start: 2021-11-30 — End: 2022-08-02

## 2021-11-30 NOTE — Assessment & Plan Note (Signed)
Uncontrolled , inc amlodipine dose DASH diet and commitment to daily physical activity for a minimum of 30 minutes discussed and encouraged, as a part of hypertension management. The importance of attaining a healthy weight is also discussed.     11/29/2021    4:19 PM 11/29/2021    4:18 PM 11/29/2021    4:05 PM 05/02/2021    8:57 AM 05/02/2021    8:38 AM 02/02/2021    9:32 AM 02/02/2021    9:06 AM  BP/Weight  Systolic BP 754 360 677 034 035 248 185  Diastolic BP 90 90 95 92 91 98 102  Wt. (Lbs)   244  231.12  236.04  BMI   40.6 kg/m2  38.46 kg/m2  39.28 kg/m2

## 2021-11-30 NOTE — Assessment & Plan Note (Signed)
deteriorrated   Patient re-educated about  the importance of commitment to a  minimum of 150 minutes of exercise per week as able.  The importance of healthy food choices with portion control discussed, as well as eating regularly and within a 12 hour window most days. The need to choose "clean , green" food 50 to 75% of the time is discussed, as well as to make water the primary drink and set a goal of 64 ounces water daily.       11/29/2021    4:05 PM 05/02/2021    8:38 AM 02/02/2021    9:06 AM  Weight /BMI  Weight 244 lb 231 lb 1.9 oz 236 lb 0.6 oz  Height '5\' 5"'$  (1.651 m) '5\' 5"'$  (1.651 m) '5\' 5"'$  (1.651 m)  BMI 40.6 kg/m2 38.46 kg/m2 39.28 kg/m2

## 2021-11-30 NOTE — Assessment & Plan Note (Signed)
Hyperlipidemia:Low fat diet discussed and encouraged.   Lipid Panel  Lab Results  Component Value Date   CHOL 219 (H) 11/29/2021   HDL 56 11/29/2021   LDLCALC 145 (H) 11/29/2021   TRIG 104 11/29/2021   CHOLHDL 3.9 11/29/2021     Needs to lower fay intake

## 2021-11-30 NOTE — Assessment & Plan Note (Signed)
Needs to commit to weekly vit D 

## 2021-12-02 DIAGNOSIS — R7989 Other specified abnormal findings of blood chemistry: Secondary | ICD-10-CM | POA: Insufficient documentation

## 2021-12-02 NOTE — Assessment & Plan Note (Signed)
NEEDS WEEKLY SUPPLEMENT FOR AT LEAST 6 MONTHS

## 2021-12-06 ENCOUNTER — Encounter: Payer: Self-pay | Admitting: Family Medicine

## 2021-12-18 ENCOUNTER — Other Ambulatory Visit: Payer: Self-pay | Admitting: Family Medicine

## 2022-03-15 ENCOUNTER — Other Ambulatory Visit: Payer: Self-pay

## 2022-03-15 ENCOUNTER — Encounter: Payer: Self-pay | Admitting: Family Medicine

## 2022-03-15 ENCOUNTER — Ambulatory Visit (INDEPENDENT_AMBULATORY_CARE_PROVIDER_SITE_OTHER): Payer: 59 | Admitting: Family Medicine

## 2022-03-15 VITALS — BP 128/84 | HR 99 | Ht 65.0 in | Wt 203.0 lb

## 2022-03-15 DIAGNOSIS — I10 Essential (primary) hypertension: Secondary | ICD-10-CM | POA: Diagnosis not present

## 2022-03-15 DIAGNOSIS — E669 Obesity, unspecified: Secondary | ICD-10-CM

## 2022-03-15 DIAGNOSIS — R7302 Impaired glucose tolerance (oral): Secondary | ICD-10-CM | POA: Diagnosis not present

## 2022-03-15 DIAGNOSIS — E66811 Obesity, class 1: Secondary | ICD-10-CM

## 2022-03-15 DIAGNOSIS — Z23 Encounter for immunization: Secondary | ICD-10-CM

## 2022-03-15 DIAGNOSIS — E785 Hyperlipidemia, unspecified: Secondary | ICD-10-CM

## 2022-03-15 DIAGNOSIS — Z13220 Encounter for screening for lipoid disorders: Secondary | ICD-10-CM

## 2022-03-15 DIAGNOSIS — D171 Benign lipomatous neoplasm of skin and subcutaneous tissue of trunk: Secondary | ICD-10-CM

## 2022-03-15 NOTE — Patient Instructions (Addendum)
Annual exam last  week in April, call if you need me sooner  CONGRATULATIONS!!!, keep up the GREAT work!!!  Shingrix #2 and flu vaccine today  Please get Covid vaccine at your pharmacy in next 2 weeks  Lipid , cmp and EGFr and HBA1C today  Medication is sent to mail order  You will be referred for [place] of nodule on left upper abdomen/ lower chest , I believe this is a benign/ not cancerous , fatty growth/ lipoma  All the very best for 2024 to ypou and your family!  Thanks for choosing [facility], we consider it a privelige to serve you.

## 2022-03-16 LAB — CMP14+EGFR
ALT: 15 IU/L (ref 0–32)
AST: 17 IU/L (ref 0–40)
Albumin/Globulin Ratio: 1.7 (ref 1.2–2.2)
Albumin: 4.6 g/dL (ref 3.9–4.9)
Alkaline Phosphatase: 52 IU/L (ref 44–121)
BUN/Creatinine Ratio: 14 (ref 9–23)
BUN: 13 mg/dL (ref 6–24)
Bilirubin Total: 0.7 mg/dL (ref 0.0–1.2)
CO2: 27 mmol/L (ref 20–29)
Calcium: 9.6 mg/dL (ref 8.7–10.2)
Chloride: 99 mmol/L (ref 96–106)
Creatinine, Ser: 0.91 mg/dL (ref 0.57–1.00)
Globulin, Total: 2.7 g/dL (ref 1.5–4.5)
Glucose: 86 mg/dL (ref 70–99)
Potassium: 3.1 mmol/L — ABNORMAL LOW (ref 3.5–5.2)
Sodium: 140 mmol/L (ref 134–144)
Total Protein: 7.3 g/dL (ref 6.0–8.5)
eGFR: 77 mL/min/{1.73_m2} (ref >59)

## 2022-03-16 LAB — LIPID PANEL
Chol/HDL Ratio: 3.3 ratio (ref 0.0–4.4)
Cholesterol, Total: 177 mg/dL (ref 100–199)
HDL: 53 mg/dL (ref >39)
LDL Chol Calc (NIH): 112 mg/dL — ABNORMAL HIGH (ref 0–99)
Triglycerides: 63 mg/dL (ref 0–149)
VLDL Cholesterol Cal: 12 mg/dL (ref 5–40)

## 2022-03-16 LAB — HEMOGLOBIN A1C
Est. average glucose Bld gHb Est-mCnc: 91 mg/dL
Hgb A1c MFr Bld: 4.8 % (ref 4.8–5.6)

## 2022-03-16 MED ORDER — POTASSIUM CHLORIDE CRYS ER 20 MEQ PO TBCR: EXTENDED_RELEASE_TABLET | ORAL | 0 refills | Status: DC

## 2022-03-16 MED ORDER — TRIAMTERENE-HCTZ 75-50 MG PO TABS: 1.0000 | ORAL_TABLET | Freq: Every day | ORAL | 3 refills | Status: DC

## 2022-03-16 MED ORDER — AMLODIPINE BESYLATE 5 MG PO TABS: 5.0000 mg | ORAL_TABLET | Freq: Every day | ORAL | 1 refills | Status: DC

## 2022-03-17 ENCOUNTER — Encounter: Payer: Self-pay | Admitting: Family Medicine

## 2022-03-17 DIAGNOSIS — Q212 Atrioventricular septal defect, unspecified as to partial or complete: Secondary | ICD-10-CM | POA: Insufficient documentation

## 2022-03-17 DIAGNOSIS — D179 Benign lipomatous neoplasm, unspecified: Secondary | ICD-10-CM | POA: Insufficient documentation

## 2022-03-17 NOTE — Assessment & Plan Note (Signed)
Controlled, no change in medication DASH diet and commitment to daily physical activity for a minimum of 30 minutes discussed and encouraged, as a part of hypertension management. The importance of attaining a healthy weight is also discussed.     03/15/2022    3:58 PM 03/15/2022    3:36 PM 03/15/2022    3:33 PM 11/29/2021    4:19 PM 11/29/2021    4:18 PM 11/29/2021    4:05 PM 05/02/2021    8:57 AM  BP/Weight  Systolic BP 730 856 943 700 525 910 289  Diastolic BP 84 82 93 90 90 95 92  Wt. (Lbs)   203   244   BMI   33.78 kg/m2   40.6 kg/m2

## 2022-03-17 NOTE — Progress Notes (Signed)
Sherry Wagner     MRN: [id number]      DOB: 07-27-1970   HPI Sherry Wagner is here for follow up and re-evaluation of chronic medical conditions, medication management and review of any available recent lab and radiology data.  Preventive health is updated, specifically  Cancer screening and Immunization.   Questions or concerns regarding consultations or procedures which the PT has had in the interim are  addressed. The PT denies any adverse reactions to current medications since the last visit.  C/o " mass/ lump" in left abdomen, noted in past several months, non tender   ROS Denies recent fever or chills. Denies sinus pressure, nasal congestion, ear pain or sore throat. Denies chest congestion, productive cough or wheezing. Denies chest pains, palpitations and leg swelling Denies abdominal pain, nausea, vomiting,diarrhea or constipation.   Denies dysuria, frequency, hesitancy or incontinence. Denies joint pain, swelling and limitation in mobility. Denies headaches, seizures, numbness, or tingling. Denies depression, anxiety or insomnia. Denies skin break down or rash.   PE  BP 128/84   Pulse 99   Ht '5\' 5"'$  (1.651 m)   Wt 203 lb (92.1 kg)   SpO2 98%   BMI 33.78 kg/m   Patient alert and oriented and in no cardiopulmonary distress.  HEENT: No facial asymmetry, EOMI,     Neck supple .  Chest: Clear to auscultation bilaterally.  CVS: S1, S2 no murmurs, no S3.Regular rate.  ABD: Soft non tender. Subcutaneous mass/ lipoma alon left ant axillary line in upper abdomen  Ext: No edema  MS: Adequate ROM spine, shoulders, hips and knees.  Skin: Intact, no ulcerations or rash noted.  Psych: Good eye contact, normal affect. Memory intact not anxious or depressed appearing.  CNS: CN 2-12 intact, power,  normal throughout.no focal deficits noted.   Assessment & Plan  Essential hypertension Controlled, no change in medication DASH diet and commitment to daily physical  activity for a minimum of 30 minutes discussed and encouraged, as a part of hypertension management. The importance of attaining a healthy weight is also discussed.     03/15/2022    3:58 PM 03/15/2022    3:36 PM 03/15/2022    3:33 PM 11/29/2021    4:19 PM 11/29/2021    4:18 PM 11/29/2021    4:05 PM 05/02/2021    8:57 AM  BP/Weight  Systolic BP 425 956 387 564 332 951 884  Diastolic BP 84 82 93 90 90 95 92  Wt. (Lbs)   203   244   BMI   33.78 kg/m2   40.6 kg/m2        Obesity (BMI 30.0-34.9)  Marked improvement with lifestyle change!  Patient re-educated about  the importance of commitment to a  minimum of 150 minutes of exercise per week as able.  The importance of healthy food choices with portion control discussed, as well as eating regularly and within a 12 hour window most days. The need to choose "clean , green" food 50 to 75% of the time is discussed, as well as to make water the primary drink and set a goal of 64 ounces water daily.       03/15/2022    3:33 PM 11/29/2021    4:05 PM 05/02/2021    8:38 AM  Weight /BMI  Weight 203 lb 244 lb 231 lb 1.9 oz  Height '5\' 5"'$  (1.651 m) '5\' 5"'$  (1.651 m) '5\' 5"'$  (1.651 m)  BMI 33.78 kg/m2 40.6 kg/m2 38.46 kg/m2  Hyperlipidemia LDL goal <100 Improved , not yet at goal Hyperlipidemia:Low fat diet discussed and encouraged.   Lipid Panel  Lab Results  Component Value Date   CHOL 177 03/15/2022   HDL 53 03/15/2022   LDLCALC 112 (H) 03/15/2022   TRIG 63 03/15/2022   CHOLHDL 3.3 03/15/2022       Lipoma Soft, non tender mass in left upper abdomen, likely lipoma, will request [place] de to pt concern

## 2022-03-17 NOTE — Assessment & Plan Note (Signed)
Marked improvement with lifestyle change!  Patient re-educated about  the importance of commitment to a  minimum of 150 minutes of exercise per week as able.  The importance of healthy food choices with portion control discussed, as well as eating regularly and within a 12 hour window most days. The need to choose "clean , green" food 50 to 75% of the time is discussed, as well as to make water the primary drink and set a goal of 64 ounces water daily.       03/15/2022    3:33 PM 11/29/2021    4:05 PM 05/02/2021    8:38 AM  Weight /BMI  Weight 203 lb 244 lb 231 lb 1.9 oz  Height '5\' 5"'$  (1.651 m) '5\' 5"'$  (1.651 m) '5\' 5"'$  (1.651 m)  BMI 33.78 kg/m2 40.6 kg/m2 38.46 kg/m2

## 2022-03-17 NOTE — Assessment & Plan Note (Signed)
Soft, non tender mass in left upper abdomen, likely lipoma, will request [place] de to pt concern

## 2022-03-17 NOTE — Assessment & Plan Note (Signed)
Improved , not yet at goal Hyperlipidemia:Low fat diet discussed and encouraged.   Lipid Panel  Lab Results  Component Value Date   CHOL 177 03/15/2022   HDL 53 03/15/2022   LDLCALC 112 (H) 03/15/2022   TRIG 63 03/15/2022   CHOLHDL 3.3 03/15/2022

## 2022-08-02 ENCOUNTER — Encounter: Payer: Self-pay | Admitting: Family Medicine

## 2022-08-02 ENCOUNTER — Ambulatory Visit (INDEPENDENT_AMBULATORY_CARE_PROVIDER_SITE_OTHER): Payer: 59 | Admitting: Family Medicine

## 2022-08-02 VITALS — BP 110/84 | HR 94 | Ht 65.0 in | Wt 197.1 lb

## 2022-08-02 DIAGNOSIS — Z0000 Encounter for general adult medical examination without abnormal findings: Secondary | ICD-10-CM | POA: Diagnosis not present

## 2022-08-02 DIAGNOSIS — E785 Hyperlipidemia, unspecified: Secondary | ICD-10-CM | POA: Diagnosis not present

## 2022-08-02 DIAGNOSIS — E559 Vitamin D deficiency, unspecified: Secondary | ICD-10-CM

## 2022-08-02 DIAGNOSIS — I10 Essential (primary) hypertension: Secondary | ICD-10-CM

## 2022-08-02 DIAGNOSIS — E669 Obesity, unspecified: Secondary | ICD-10-CM

## 2022-08-02 DIAGNOSIS — R7989 Other specified abnormal findings of blood chemistry: Secondary | ICD-10-CM

## 2022-08-02 MED ORDER — AMLODIPINE BESYLATE 5 MG PO TABS: 5.0000 mg | ORAL_TABLET | Freq: Every day | ORAL | 3 refills | Status: DC

## 2022-08-02 NOTE — Patient Instructions (Signed)
F/U in 6 months, call if you need me sooner  CONGRATS on excellent habits, make additional changes as we discussed please  Labs today, TSH, Vit D, CBc and chem 7 and EGFR  Please scheduel your mammogram due in August  I will message  you re colon screening appropriate test for you with lab results  Thanks for choosing [facility], we consider it a privelige to serve you.

## 2022-08-02 NOTE — Assessment & Plan Note (Signed)
Controlled, no change in medication DASH diet and commitment to daily physical activity for a minimum of 30 minutes discussed and encouraged, as a part of hypertension management. The importance of attaining a healthy weight is also discussed.     08/02/2022    8:40 AM 08/02/2022    8:13 AM 03/15/2022    3:58 PM 03/15/2022    3:36 PM 03/15/2022    3:33 PM 11/29/2021    4:19 PM 11/29/2021    4:18 PM  BP/Weight  Systolic BP 110 138 128 132 151 130 134  Diastolic BP 84 87 84 82 93 90 90  Wt. (Lbs)  197.12   203    BMI  32.8 kg/m2   33.78 kg/m2

## 2022-08-02 NOTE — Assessment & Plan Note (Signed)
Excellent weight loss with lifestyle change, she is applauded on this  Patient re-educated about  the importance of commitment to a  minimum of 150 minutes of exercise per week as able.  The importance of healthy food choices with portion control discussed, as well as eating regularly and within a 12 hour window most days. The need to choose "clean , green" food 50 to 75% of the time is discussed, as well as to make water the primary drink and set a goal of 64 ounces water daily.       08/02/2022    8:13 AM 03/15/2022    3:33 PM 11/29/2021    4:05 PM  Weight /BMI  Weight 197 lb 1.9 oz 203 lb 244 lb  Height 5\' 5"  (1.651 m) 5\' 5"  (1.651 m) 5\' 5"  (1.651 m)  BMI 32.8 kg/m2 33.78 kg/m2 40.6 kg/m2

## 2022-08-02 NOTE — Progress Notes (Signed)
Sherry Wagner     MRN: [id number]      DOB: 10/03/1970  HPI: Patient is in for annual physical exam. C/o feeling cold, menses still regular. Immunization is reviewed , and  is up to datePE:.BP 110/84   Pulse 94   Ht 5\' 5"  (1.651 m)   Wt 197 lb 1.9 oz (89.4 kg)   SpO2 93%   BMI 32.80 kg/m   Pleasant  female, alert and oriented x 3, in no cardio-pulmonary distress. Afebrile. HEENT No facial trauma or asymetry. Sinuses non tender.  Extra occullar muscles intact.. External ears normal, . Neck: supple, no adenopathy,JVD or thyromegaly.No bruits.  Chest: Clear to ascultation bilaterally.No crackles or wheezes. Non tender to palpation  Cardiovascular system; Heart sounds normal,  S1 and  S2 ,no S3.   Abdomen: Soft, non tender, no organomegaly or masses. Bowel sounds normal. No guarding, tenderness or rebound.   GU: Deferred Musculoskeletal exam: Full ROM of spine, hips , shoulders and knees. No deformity ,swelling or crepitus noted. No muscle wasting or atrophy.   Neurologic: Cranial nerves 2 to 12 intact. Power, tone ,sensation and reflexes normal throughout. No disturbance in gait. No tremor.  Skin: Intact, no ulceration, erythema , scaling or rash noted. Pigmentation normal throughout  Psych; Normal mood and affect. Judgement and concentration normal   Assessment & Plan:  Annual physical exam Annual exam as documented. Counseling done  re healthy lifestyle involving commitment to 150 minutes exercise per week, heart healthy diet, and attaining healthy weight.The importance of adequate sleep also discussed. Regular seat belt use and home safety, is also discussed. Changes in health habits are decided on by the patient with goals and time frames  set for achieving them. Immunization and cancer screening needs are specifically addressed at this visit.   Essential hypertension Controlled, no change in medication DASH diet and commitment to daily physical  activity for a minimum of 30 minutes discussed and encouraged, as a part of hypertension management. The importance of attaining a healthy weight is also discussed.     08/02/2022    8:40 AM 08/02/2022    8:13 AM 03/15/2022    3:58 PM 03/15/2022    3:36 PM 03/15/2022    3:33 PM 11/29/2021    4:19 PM 11/29/2021    4:18 PM  BP/Weight  Systolic BP 110 138 128 132 151 130 134  Diastolic BP 84 87 84 82 93 90 90  Wt. (Lbs)  197.12   203    BMI  32.8 kg/m2   33.78 kg/m2         Low serum vitamin D Updated lab needed at/ before next visit.   Obesity (BMI 30.0-34.9) Excellent weight loss with lifestyle change, she is applauded on this  Patient re-educated about  the importance of commitment to a  minimum of 150 minutes of exercise per week as able.  The importance of healthy food choices with portion control discussed, as well as eating regularly and within a 12 hour window most days. The need to choose "clean , green" food 50 to 75% of the time is discussed, as well as to make water the primary drink and set a goal of 64 ounces water daily.       08/02/2022    8:13 AM 03/15/2022    3:33 PM 11/29/2021    4:05 PM  Weight /BMI  Weight 197 lb 1.9 oz 203 lb 244 lb  Height 5\' 5"  (1.651 m) 5\' 5"  (1.651  m) 5\' 5"  (1.651 m)  BMI 32.8 kg/m2 33.78 kg/m2 40.6 kg/m2

## 2022-08-02 NOTE — Assessment & Plan Note (Signed)

## 2022-08-02 NOTE — Assessment & Plan Note (Signed)
Updated lab needed at/ before next visit.   

## 2022-08-03 LAB — BMP8+EGFR
BUN/Creatinine Ratio: 13 (ref 9–23)
BUN: 13 mg/dL (ref 6–24)
CO2: 21 mmol/L (ref 20–29)
Calcium: 10.1 mg/dL (ref 8.7–10.2)
Chloride: 98 mmol/L (ref 96–106)
Creatinine, Ser: 1.02 mg/dL — ABNORMAL HIGH (ref 0.57–1.00)
Glucose: 80 mg/dL (ref 70–99)
Potassium: 3.8 mmol/L (ref 3.5–5.2)
Sodium: 138 mmol/L (ref 134–144)
eGFR: 67 mL/min/{1.73_m2} (ref >59)

## 2022-08-03 LAB — VITAMIN D 25 HYDROXY (VIT D DEFICIENCY, FRACTURES): Vit D, 25-Hydroxy: 11.1 ng/mL — ABNORMAL LOW (ref 30.0–100.0)

## 2022-08-03 LAB — CBC
Hematocrit: 44.8 % (ref 34.0–46.6)
Hemoglobin: 15.1 g/dL (ref 11.1–15.9)
MCH: 29.7 pg (ref 26.6–33.0)
MCHC: 33.7 g/dL (ref 31.5–35.7)
MCV: 88 fL (ref 79–97)
Platelets: 324 10*3/uL (ref 150–450)
RBC: 5.08 x10E6/uL (ref 3.77–5.28)
RDW: 12.5 % (ref 11.7–15.4)
WBC: 6.8 10*3/uL (ref 3.4–10.8)

## 2022-08-03 LAB — TSH: TSH: 1.62 u[IU]/mL (ref 0.450–4.500)

## 2022-08-03 MED ORDER — VITAMIN D (ERGOCALCIFEROL) 1.25 MG (50000 UNIT) PO CAPS: 50000.0000 [IU] | ORAL_CAPSULE | ORAL | 3 refills | Status: DC

## 2022-08-03 NOTE — Addendum Note (Signed)
Addended by: Kerri Perches on: 08/03/2022 08:46 AM   Modules accepted: Orders

## 2022-10-10 ENCOUNTER — Other Ambulatory Visit: Payer: Self-pay | Admitting: Family Medicine

## 2023-01-16 ENCOUNTER — Other Ambulatory Visit: Payer: Self-pay | Admitting: Family Medicine

## 2023-01-16 DIAGNOSIS — Z1231 Encounter for screening mammogram for malignant neoplasm of breast: Secondary | ICD-10-CM

## 2023-01-22 ENCOUNTER — Other Ambulatory Visit: Payer: Self-pay | Admitting: Family Medicine

## 2023-02-04 ENCOUNTER — Ambulatory Visit: Payer: 59 | Admitting: Family Medicine

## 2023-02-07 ENCOUNTER — Ambulatory Visit
Admission: RE | Admit: 2023-02-07 | Discharge: 2023-02-07 | Disposition: A | Discharge status: Home / Self Care | Payer: 59 | Source: Ambulatory Visit | Attending: Family Medicine | Admitting: Family Medicine

## 2023-02-07 DIAGNOSIS — Z1231 Encounter for screening mammogram for malignant neoplasm of breast: Secondary | ICD-10-CM

## 2023-02-13 IMAGING — MG MM DIGITAL SCREENING BILAT W/ TOMO AND CAD
8 of 15 series · 8 of 40 positions shown · non-contrast
Comparison: Previous exam(s).

ACR Breast Density Category a: The breast tissue is almost entirely
fatty.

CLINICAL DATA: Screening.

EXAM:
DIGITAL SCREENING BILATERAL MAMMOGRAM WITH TOMOSYNTHESIS AND CAD
TECHNIQUE: Bilateral screening digital craniocaudal and mediolateral oblique
mammograms were obtained. Bilateral screening digital breast
tomosynthesis was performed. The images were evaluated with
computer-aided detection.

[L MLO synth-2D (1 of 2)]
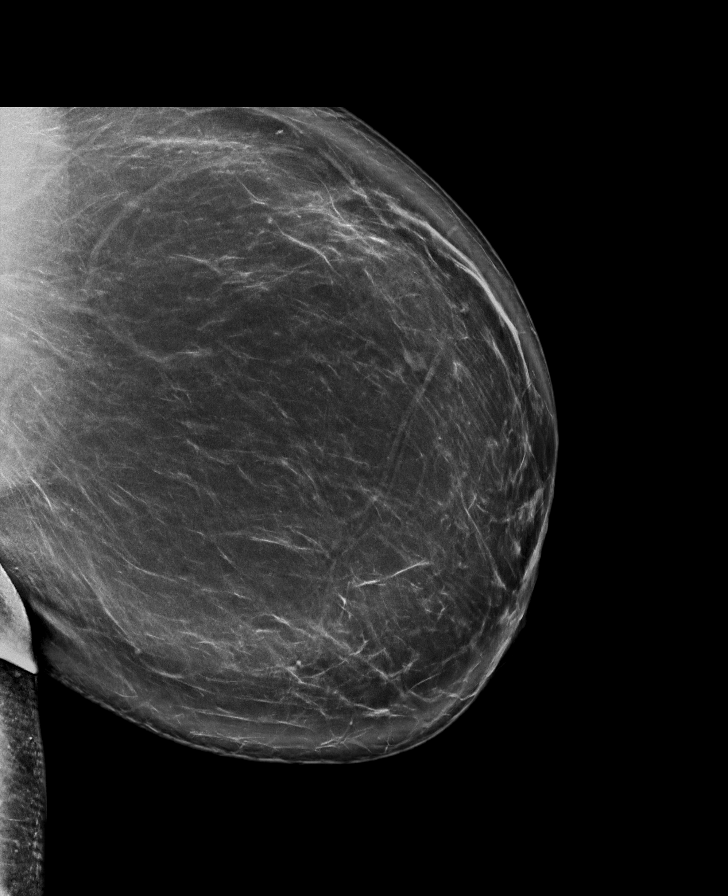

[R MLO synth-2D (1 of 2)]
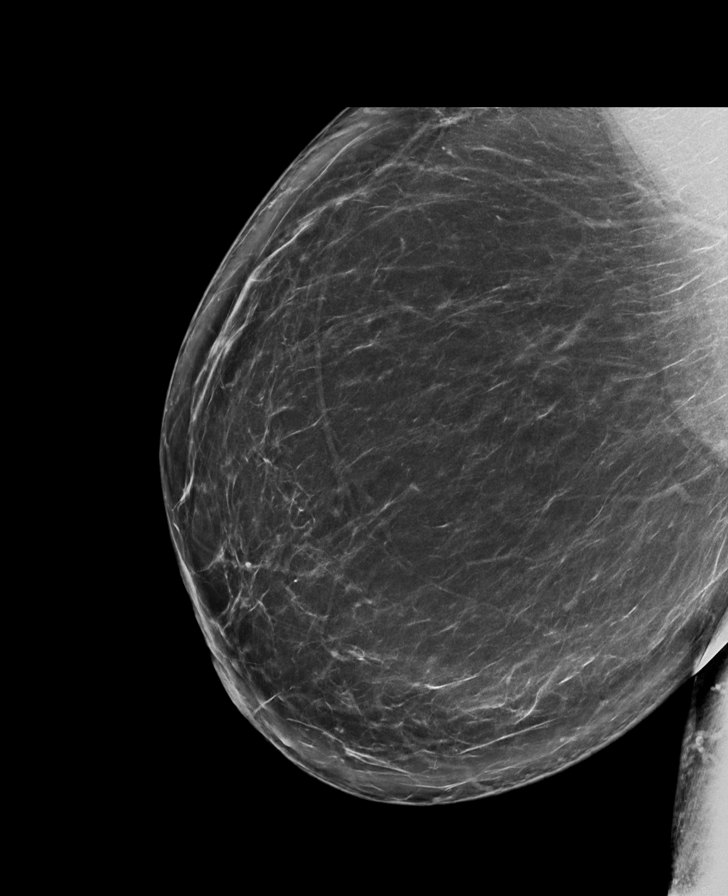

[L CC synth-2D (1 of 2)]
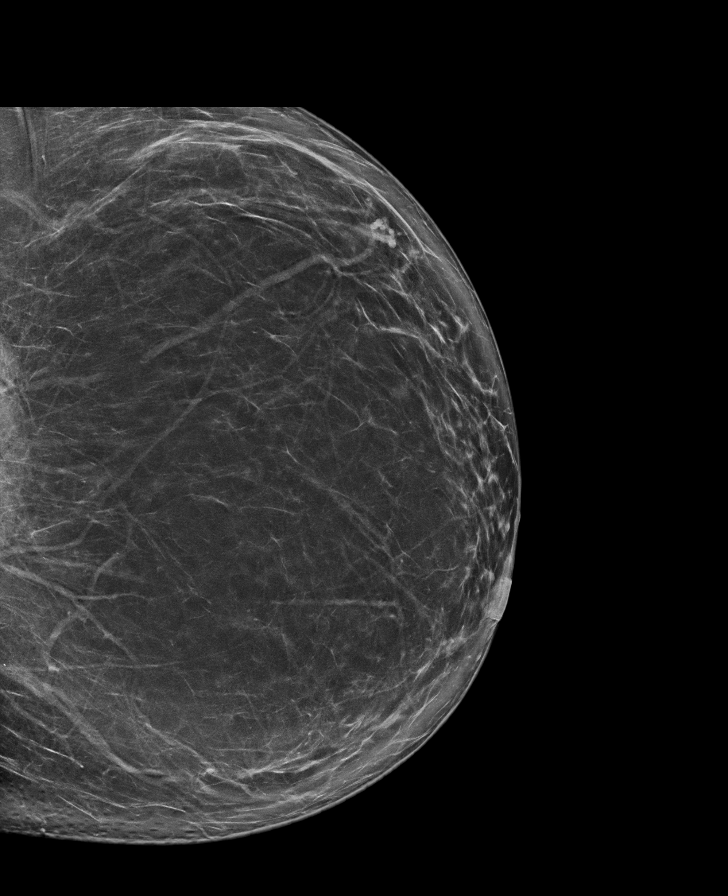

[L MLO synth-2D (2 of 2)]
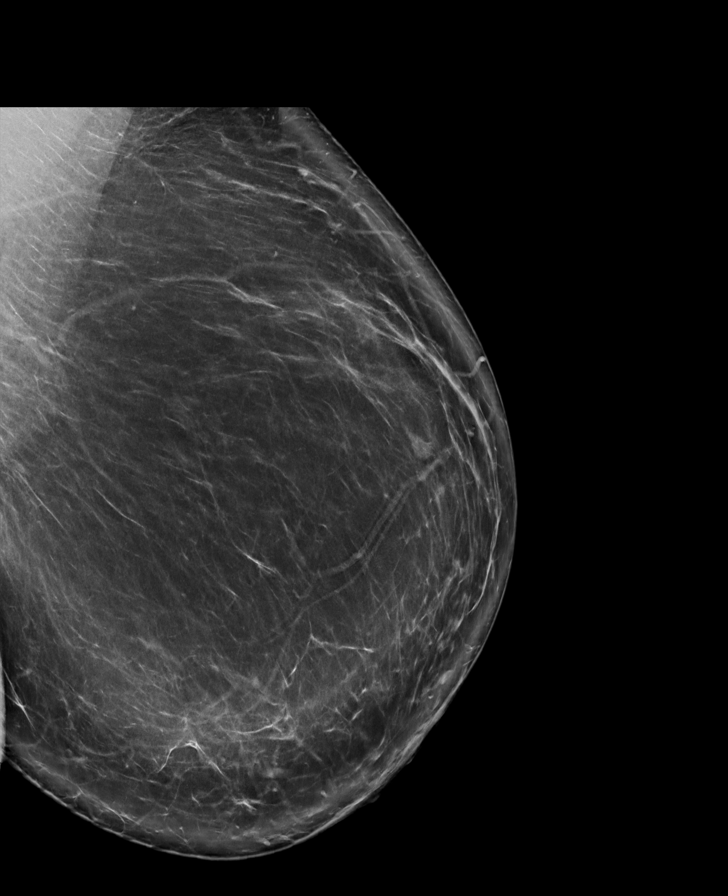

[R MLO synth-2D (2 of 2)]
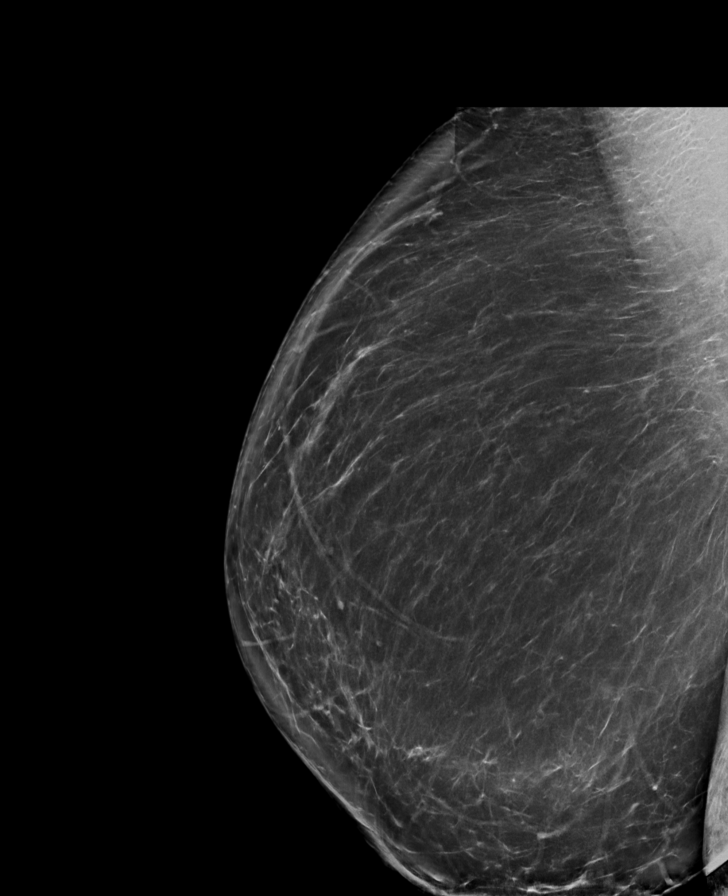

[R CC synth-2D]
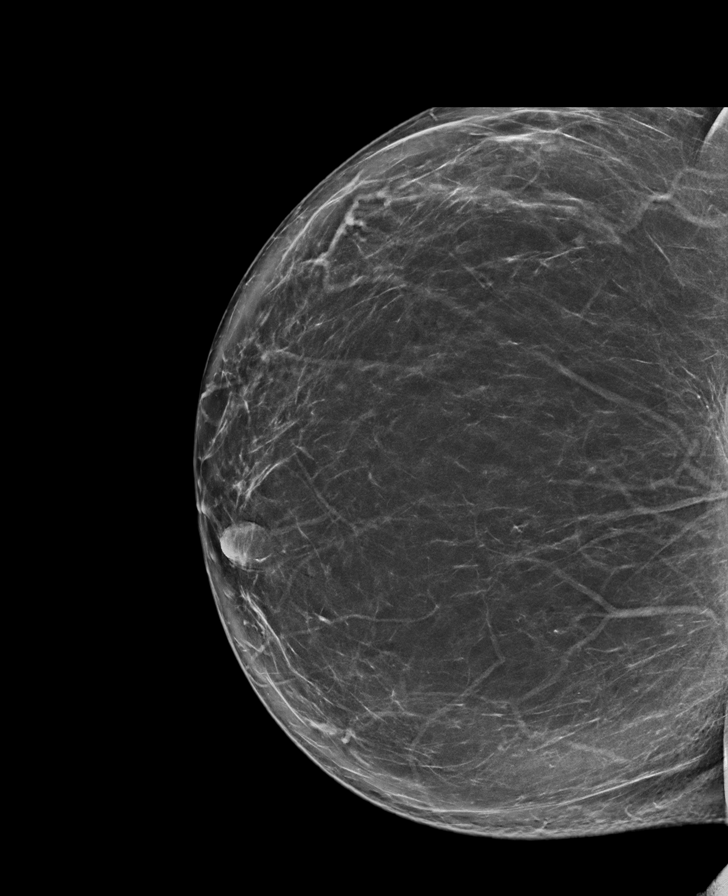

[L CC synth-2D (2 of 2)]
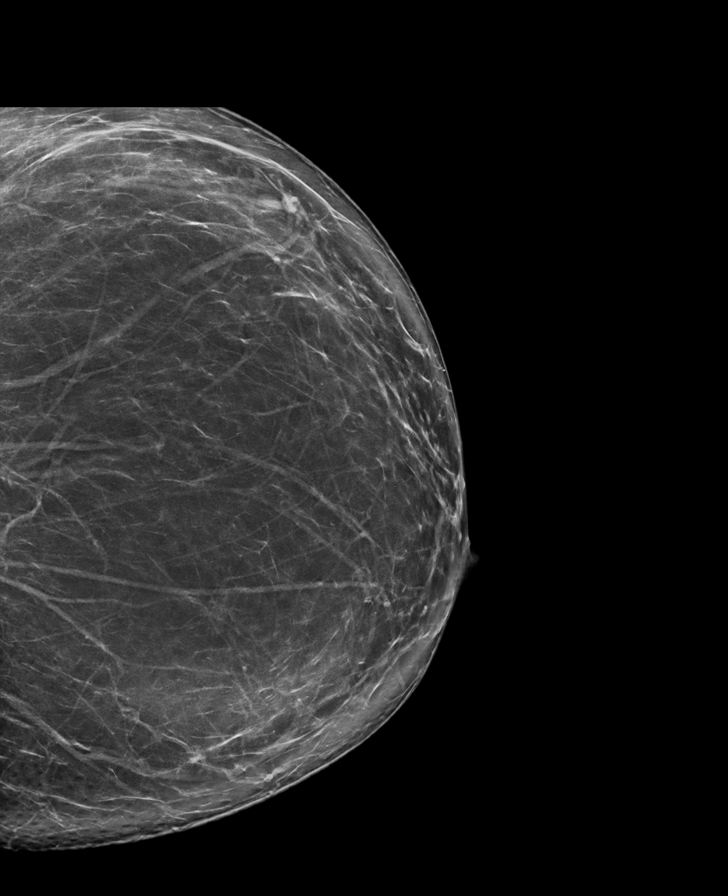

[L MLO tomo · tomo slice 73/107.0]
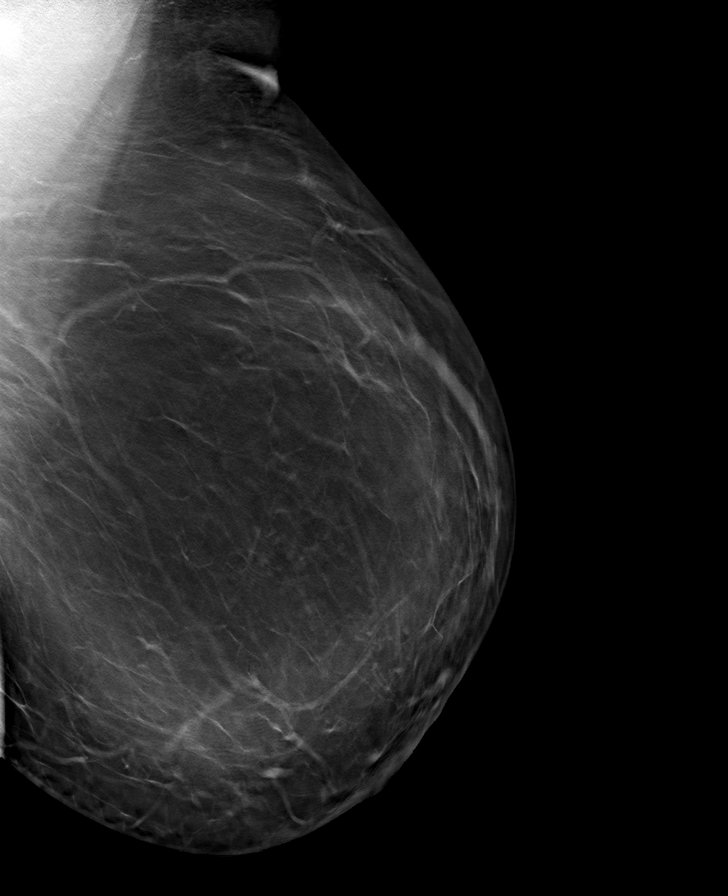

[8 of 40 positions shown; findings below may reference images not displayed]

FINDINGS: There are no findings suspicious for malignancy. The images were
evaluated with computer-aided detection.
IMPRESSION: No mammographic evidence of malignancy. A result letter of this
screening mammogram will be mailed directly to the patient.

RECOMMENDATION:
Screening mammogram in one year. (Code:JP-[id number])

BI-RADS CATEGORY  1: Negative.

## 2023-08-06 ENCOUNTER — Encounter: Payer: 59 | Admitting: Family Medicine

## 2023-09-25 ENCOUNTER — Encounter: Admitting: Family Medicine

## 2023-09-27 ENCOUNTER — Other Ambulatory Visit: Payer: Self-pay | Admitting: Family Medicine

## 2023-10-29 ENCOUNTER — Encounter: Payer: Self-pay | Admitting: Family Medicine

## 2023-10-29 ENCOUNTER — Ambulatory Visit (INDEPENDENT_AMBULATORY_CARE_PROVIDER_SITE_OTHER): Admitting: Family Medicine

## 2023-10-29 ENCOUNTER — Other Ambulatory Visit: Payer: Self-pay | Admitting: Family Medicine

## 2023-10-29 VITALS — BP 132/82 | HR 88 | Resp 18 | Ht 65.0 in | Wt 242.0 lb

## 2023-10-29 DIAGNOSIS — E785 Hyperlipidemia, unspecified: Secondary | ICD-10-CM

## 2023-10-29 DIAGNOSIS — Z807 Family history of other malignant neoplasms of lymphoid, hematopoietic and related tissues: Secondary | ICD-10-CM

## 2023-10-29 DIAGNOSIS — Z8051 Family history of malignant neoplasm of kidney: Secondary | ICD-10-CM | POA: Diagnosis not present

## 2023-10-29 DIAGNOSIS — R55 Syncope and collapse: Secondary | ICD-10-CM

## 2023-10-29 DIAGNOSIS — Z0000 Encounter for general adult medical examination without abnormal findings: Secondary | ICD-10-CM

## 2023-10-29 DIAGNOSIS — R7989 Other specified abnormal findings of blood chemistry: Secondary | ICD-10-CM

## 2023-10-29 DIAGNOSIS — Z1231 Encounter for screening mammogram for malignant neoplasm of breast: Secondary | ICD-10-CM

## 2023-10-29 DIAGNOSIS — I10 Essential (primary) hypertension: Secondary | ICD-10-CM

## 2023-10-29 NOTE — Patient Instructions (Addendum)
 Annual exam in 1 year  [facility]  Labs today, CBC, lipid, cmp and EGFr, tSH and free T4, Vit D, HbA1C  Nurse visits to be scheduled for 3 hepatitis B vaccine visits  You are being referred to cardiology due to near syncopal episode and long standing hypertension  Yoh are referred or US  of your kidneys due to strong f/h of kidney cancer  It is important that you exercise regularly at least 30 minutes 5 times a week. If you develop chest pain, have severe difficulty breathing, or feel very tired, stop exercising immediately and seek medical attention   Think about what you will eat, plan ahead. Choose  clean, green, fresh or frozen over canned, processed or packaged foods which are more sugary, salty and fatty. 70 to 75% of food eaten should be vegetables and fruit. Three meals at set times with snacks allowed between meals, but they must be fruit or vegetables. Aim to eat over a 12 hour period , example 7 am to 7 pm, and STOP after  your last meal of the day. Drink water,generally about 64 ounces per day, no other drink is as healthy. Fruit juice is best enjoyed in a healthy way, by EATING the fruit.   Weight loss goal of 20 to 25 pounds in 6 months, I am looking for your my chart message in Jan 2026!  Thanks for choosing [facility], we consider it a privelige to serve you.

## 2023-10-29 NOTE — Progress Notes (Signed)
 Sherry Wagner     MRN: [id number]      DOB: May 24, 1970  Chief Complaint  Patient presents with   Annual Exam    Cpe. Wants to discuss when she should get her next US  done    HPI: Patient is in for annual physical exam.  Near syncopal eopisode 02/2023 w while witnessing skin tag removal of her 52 y/o daughter, no incontince or jerking, recalls hearng everything but unable to responf appropriately with speech, duuration may be 5 to 10 mins, , experienced extreme heat, trying to get some air, no nausea , never chest pain 2 similar episodes with BM had significant  cramping  after the BM , 2 episodes  in past 2 years and 1 previously approx 8 years ago. Recent labs,  are reviewed. Immunization is reviewed , and  updated if needed.   PE: BP 132/82   Pulse 88   Resp 18   Ht 5' 5 (1.651 m)   Wt 242 lb 0.6 oz (109.8 kg)   SpO2 94%   BMI 40.28 kg/m   Pleasant  female, alert and oriented x 3, in no cardio-pulmonary distress. Afebrile. HEENT No facial trauma or asymetry. Sinuses non tender.  Extra occullar muscles intact.. External ears normal, . Neck: supple, no adenopathy,JVD or thyromegaly.No bruits.  Chest: Clear to ascultation bilaterally.No crackles or wheezes. Non tender to palpation  Breast: Asymptomatic, not examined , mammogram UTD  Cardiovascular system; Heart sounds normal,  S1 and  S2 ,no S3.  No murmur, or thrill.  Peripheral pulses normal.  Abdomen: Soft, non tender, n   GU: Asymptomatic, not examined Pap is up to date  Musculoskeletal exam: Full ROM of spine, hips , shoulders and knees. No deformity ,swelling or crepitus noted. No muscle wasting or atrophy.   Neurologic: Cranial nerves 2 to 12 intact. Power, tone ,sensation  normal throughout. No disturbance in gait. No tremor.  Skin: Intact, no ulceration, erythema , scaling or rash noted. Pigmentation normal throughout  Psych; Normal mood and affect. Judgement and concentration  normal   Assessment & Plan:  Near syncope Three episodes in past 2 years , most recent in 02/2023, likely vasovagal , however will refer for Cardiology eval, longstanding hypertension and morbid obesity  Annual physical exam Annual exam as documented. Counseling done  re healthy lifestyle involving commitment to 150 minutes exercise per week, heart healthy diet, and attaining healthy weight.The importance of adequate sleep also discussed.  Changes in health habits are decided on by the patient with goals and time frames  set for achieving them. Immunization and cancer screening needs are specifically addressed at this visit.   Morbid obesity (HCC)  Patient re-educated about  the importance of commitment to a  minimum of 150 minutes of exercise per week as able.  The importance of healthy food choices with portion control discussed, as well as eating regularly and within a 12 hour window most days. The need to choose clean , green food 50 to 75% of the time is discussed, as well as to make water the primary drink and set a goal of 64 ounces water daily.       10/29/2023    8:21 AM 08/02/2022    8:13 AM 03/15/2022    3:33 PM  Weight /BMI  Weight 242 lb 0.6 oz 197 lb 1.9 oz 203 lb  Height 5' 5 (1.651 m) 5' 5 (1.651 m) 5' 5 (1.651 m)  BMI 40.28 kg/m2 32.8 kg/m2 33.78  kg/m2    Significant weight re gain will change diet and exercise routine as she has done in the past, re eval in 1 year, message if no success in 3 to 4 months  Family history of kidney cancer Refer for renal US   Low serum vitamin D  Needs weekly supplement

## 2023-10-30 ENCOUNTER — Ambulatory Visit: Payer: Self-pay | Admitting: Family Medicine

## 2023-10-30 LAB — LIPID PANEL
Chol/HDL Ratio: 3.6 ratio (ref 0.0–4.4)
Cholesterol, Total: 186 mg/dL (ref 100–199)
HDL: 52 mg/dL (ref >39)
LDL Chol Calc (NIH): 114 mg/dL — ABNORMAL HIGH (ref 0–99)
Triglycerides: 109 mg/dL (ref 0–149)
VLDL Cholesterol Cal: 20 mg/dL (ref 5–40)

## 2023-10-30 LAB — CBC WITH DIFFERENTIAL/PLATELET
Basophils Absolute: 0.1 x10E3/uL (ref 0.0–0.2)
Basos: 1 %
EOS (ABSOLUTE): 0.1 x10E3/uL (ref 0.0–0.4)
Eos: 1 %
Hematocrit: 43.2 % (ref 34.0–46.6)
Hemoglobin: 15 g/dL (ref 11.1–15.9)
Immature Grans (Abs): 0 x10E3/uL (ref 0.0–0.1)
Immature Granulocytes: 0 %
Lymphocytes Absolute: 2.1 x10E3/uL (ref 0.7–3.1)
Lymphs: 20 %
MCH: 31.7 pg (ref 26.6–33.0)
MCHC: 34.7 g/dL (ref 31.5–35.7)
MCV: 91 fL (ref 79–97)
Monocytes Absolute: 0.7 x10E3/uL (ref 0.1–0.9)
Monocytes: 7 %
Neutrophils Absolute: 7.2 x10E3/uL — ABNORMAL HIGH (ref 1.4–7.0)
Neutrophils: 71 %
Platelets: 323 x10E3/uL (ref 150–450)
RBC: 4.73 x10E6/uL (ref 3.77–5.28)
RDW: 13.1 % (ref 11.7–15.4)
WBC: 10.1 x10E3/uL (ref 3.4–10.8)

## 2023-10-30 LAB — CMP14+EGFR
ALT: 14 IU/L (ref 0–32)
AST: 19 IU/L (ref 0–40)
Albumin: 4.4 g/dL (ref 3.8–4.9)
Alkaline Phosphatase: 72 IU/L (ref 44–121)
BUN/Creatinine Ratio: 10 (ref 9–23)
BUN: 10 mg/dL (ref 6–24)
Bilirubin Total: 0.5 mg/dL (ref 0.0–1.2)
CO2: 25 mmol/L (ref 20–29)
Calcium: 9.4 mg/dL (ref 8.7–10.2)
Chloride: 96 mmol/L (ref 96–106)
Creatinine, Ser: 1.04 mg/dL — ABNORMAL HIGH (ref 0.57–1.00)
Globulin, Total: 3.1 g/dL (ref 1.5–4.5)
Glucose: 100 mg/dL — ABNORMAL HIGH (ref 70–99)
Potassium: 3.5 mmol/L (ref 3.5–5.2)
Sodium: 138 mmol/L (ref 134–144)
Total Protein: 7.5 g/dL (ref 6.0–8.5)
eGFR: 65 mL/min/1.73 (ref >59)

## 2023-10-30 LAB — VITAMIN D 25 HYDROXY (VIT D DEFICIENCY, FRACTURES): Vit D, 25-Hydroxy: 16.1 ng/mL — ABNORMAL LOW (ref 30.0–100.0)

## 2023-10-30 LAB — TSH+FREE T4
Free T4: 1.04 ng/dL (ref 0.82–1.77)
TSH: 1.87 u[IU]/mL (ref 0.450–4.500)

## 2023-10-30 LAB — HEMOGLOBIN A1C
Est. average glucose Bld gHb Est-mCnc: 103 mg/dL
Hgb A1c MFr Bld: 5.2 % (ref 4.8–5.6)

## 2023-10-30 MED ORDER — VITAMIN D (ERGOCALCIFEROL) 1.25 MG (50000 UNIT) PO CAPS: 50000.0000 [IU] | ORAL_CAPSULE | ORAL | 3 refills | Status: AC

## 2023-11-05 ENCOUNTER — Other Ambulatory Visit (HOSPITAL_COMMUNITY)

## 2023-11-05 ENCOUNTER — Inpatient Hospital Stay
Admission: RE | Admit: 2023-11-05 | Discharge: 2023-11-05 | Discharge status: Home / Self Care | Source: Ambulatory Visit | Attending: Family Medicine | Admitting: Family Medicine

## 2023-11-05 DIAGNOSIS — Z8051 Family history of malignant neoplasm of kidney: Secondary | ICD-10-CM

## 2023-11-06 MED ORDER — POLYETHYLENE GLYCOL 3350 17 GM/SCOOP PO POWD: ORAL | 5 refills | Status: AC

## 2023-11-10 DIAGNOSIS — R55 Syncope and collapse: Secondary | ICD-10-CM | POA: Insufficient documentation

## 2023-11-10 NOTE — Assessment & Plan Note (Signed)
 Three episodes in past 2 years , most recent in 02/2023, likely vasovagal , however will refer for Cardiology eval, longstanding hypertension and morbid obesity

## 2023-11-10 NOTE — Assessment & Plan Note (Signed)
 Refer for renal US 

## 2023-11-10 NOTE — Assessment & Plan Note (Signed)
Annual exam as documented. Counseling done  re healthy lifestyle involving commitment to 150 minutes exercise per week, heart healthy diet, and attaining healthy weight.The importance of adequate sleep also discussed. Changes in health habits are decided on by the patient with goals and time frames  set for achieving them. Immunization and cancer screening needs are specifically addressed at this visit. 

## 2023-11-10 NOTE — Assessment & Plan Note (Signed)
  Patient re-educated about  the importance of commitment to a  minimum of 150 minutes of exercise per week as able.  The importance of healthy food choices with portion control discussed, as well as eating regularly and within a 12 hour window most days. The need to choose clean , green food 50 to 75% of the time is discussed, as well as to make water the primary drink and set a goal of 64 ounces water daily.       10/29/2023    8:21 AM 08/02/2022    8:13 AM 03/15/2022    3:33 PM  Weight /BMI  Weight 242 lb 0.6 oz 197 lb 1.9 oz 203 lb  Height 5' 5 (1.651 m) 5' 5 (1.651 m) 5' 5 (1.651 m)  BMI 40.28 kg/m2 32.8 kg/m2 33.78 kg/m2    Significant weight re gain will change diet and exercise routine as she has done in the past, re eval in 1 year, message if no success in 3 to 4 months

## 2023-11-10 NOTE — Assessment & Plan Note (Signed)
Needs weekly supplement 

## 2024-02-09 ENCOUNTER — Ambulatory Visit
Admission: RE | Admit: 2024-02-09 | Discharge: 2024-02-09 | Disposition: A | Source: Ambulatory Visit | Attending: Family Medicine | Admitting: Family Medicine

## 2024-02-09 DIAGNOSIS — Z1231 Encounter for screening mammogram for malignant neoplasm of breast: Secondary | ICD-10-CM

## 2024-02-13 ENCOUNTER — Encounter: Payer: Self-pay | Admitting: Family Medicine

## 2024-02-17 ENCOUNTER — Other Ambulatory Visit: Payer: Self-pay | Admitting: Family Medicine

## 2024-11-03 ENCOUNTER — Encounter: Admitting: Family Medicine
